# Patient Record
Sex: Female | Born: 1960 | Race: White | Hispanic: No | Marital: Married | Smoking: Former smoker
Health system: Southern US, Community
[De-identification: ages and names within clinical notes are randomized; demographics above are authoritative.]

## PROBLEM LIST (undated history)

## (undated) DIAGNOSIS — K279 Peptic ulcer, site unspecified, unspecified as acute or chronic, without hemorrhage or perforation: Secondary | ICD-10-CM

## (undated) DIAGNOSIS — J189 Pneumonia, unspecified organism: Secondary | ICD-10-CM

## (undated) DIAGNOSIS — M810 Age-related osteoporosis without current pathological fracture: Secondary | ICD-10-CM

## (undated) DIAGNOSIS — T7840XA Allergy, unspecified, initial encounter: Secondary | ICD-10-CM

## (undated) DIAGNOSIS — F32A Depression, unspecified: Secondary | ICD-10-CM

## (undated) DIAGNOSIS — H269 Unspecified cataract: Secondary | ICD-10-CM

## (undated) DIAGNOSIS — K219 Gastro-esophageal reflux disease without esophagitis: Secondary | ICD-10-CM

## (undated) DIAGNOSIS — F419 Anxiety disorder, unspecified: Secondary | ICD-10-CM

## (undated) DIAGNOSIS — I10 Essential (primary) hypertension: Secondary | ICD-10-CM

## (undated) DIAGNOSIS — M1990 Unspecified osteoarthritis, unspecified site: Secondary | ICD-10-CM

## (undated) HISTORY — DX: Anxiety disorder, unspecified: F41.9

## (undated) HISTORY — PX: BREAST EXCISIONAL BIOPSY: SUR124

## (undated) HISTORY — PX: TUBAL LIGATION: SHX77

## (undated) HISTORY — PX: ABDOMINAL HYSTERECTOMY: SHX81

## (undated) HISTORY — DX: Pneumonia, unspecified organism: J18.9

## (undated) HISTORY — DX: Depression, unspecified: F32.A

## (undated) HISTORY — DX: Age-related osteoporosis without current pathological fracture: M81.0

## (undated) HISTORY — PX: ADENOIDECTOMY: SHX5191

## (undated) HISTORY — DX: Allergy, unspecified, initial encounter: T78.40XA

## (undated) HISTORY — DX: Essential (primary) hypertension: I10

## (undated) HISTORY — DX: Peptic ulcer, site unspecified, unspecified as acute or chronic, without hemorrhage or perforation: K27.9

## (undated) HISTORY — PX: BREAST SURGERY: SHX581

## (undated) HISTORY — DX: Gastro-esophageal reflux disease without esophagitis: K21.9

## (undated) HISTORY — PX: BREAST BIOPSY: SHX20

## (undated) HISTORY — DX: Unspecified cataract: H26.9

## (undated) HISTORY — DX: Unspecified osteoarthritis, unspecified site: M19.90

---

## 1983-08-12 HISTORY — PX: MELANOMA EXCISION: SHX5266

## 1993-08-11 HISTORY — PX: PARTIAL HYSTERECTOMY: SHX80

## 1999-06-10 ENCOUNTER — Encounter: Payer: Self-pay | Admitting: Emergency Medicine

## 1999-06-10 ENCOUNTER — Emergency Department (HOSPITAL_COMMUNITY): Admission: EM | Admit: 1999-06-10 | Discharge: 1999-06-10 | Payer: Self-pay

## 1999-06-12 ENCOUNTER — Emergency Department (HOSPITAL_COMMUNITY): Admission: EM | Admit: 1999-06-12 | Discharge: 1999-06-12 | Payer: Self-pay | Admitting: Emergency Medicine

## 2003-01-30 ENCOUNTER — Other Ambulatory Visit: Admission: RE | Admit: 2003-01-30 | Discharge: 2003-01-30 | Payer: Self-pay | Admitting: Obstetrics and Gynecology

## 2006-08-11 HISTORY — PX: OTHER SURGICAL HISTORY: SHX169

## 2006-09-22 ENCOUNTER — Other Ambulatory Visit: Admission: RE | Admit: 2006-09-22 | Discharge: 2006-09-22 | Payer: Self-pay | Admitting: Obstetrics and Gynecology

## 2006-09-24 ENCOUNTER — Encounter: Admission: RE | Admit: 2006-09-24 | Discharge: 2006-09-24 | Payer: Self-pay | Admitting: Obstetrics and Gynecology

## 2006-09-24 ENCOUNTER — Encounter (INDEPENDENT_AMBULATORY_CARE_PROVIDER_SITE_OTHER): Payer: Self-pay | Admitting: *Deleted

## 2007-05-13 ENCOUNTER — Ambulatory Visit (HOSPITAL_BASED_OUTPATIENT_CLINIC_OR_DEPARTMENT_OTHER): Admission: RE | Admit: 2007-05-13 | Discharge: 2007-05-13 | Payer: Self-pay | Admitting: Surgery

## 2007-05-13 ENCOUNTER — Encounter (INDEPENDENT_AMBULATORY_CARE_PROVIDER_SITE_OTHER): Payer: Self-pay | Admitting: Surgery

## 2007-12-28 ENCOUNTER — Encounter: Admission: RE | Admit: 2007-12-28 | Discharge: 2007-12-28 | Payer: Self-pay | Admitting: Surgery

## 2008-01-06 ENCOUNTER — Encounter (INDEPENDENT_AMBULATORY_CARE_PROVIDER_SITE_OTHER): Payer: Self-pay | Admitting: Diagnostic Radiology

## 2008-01-06 ENCOUNTER — Encounter: Admission: RE | Admit: 2008-01-06 | Discharge: 2008-01-06 | Payer: Self-pay | Admitting: Surgery

## 2010-12-24 NOTE — Op Note (Signed)
NAMEJAY, KEMPE NO.:  [id number]   MEDICAL RECORD NO.:  [id number]          PATIENT TYPE:  AMB   LOCATION:  DSC                          FACILITY:  [facility]   PHYSICIAN:  Thornton Park. 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   PROCEDURE:  Left breast lumpectomy.   SURGEON:  Thornton Park. Daphine Deutscher, MD   ANESTHESIA:  General by LMA.   DESCRIPTION OF PROCEDURE:  Jenny Davidson was taken to room #5 at [facility] on May 13, 2007 and given general anesthesia.  The left  breast was prepped with Techni-Care and draped sterilely.  I made a  curvilinear incision more medial to the nipple and worked my work up and  got around this 5- to 7-cm mass.  Using the electrocautery, I stayed  down on it, but did not invade it to preserve to her breast tissue and I  took it off the chest wall.  It was sent for permanent sections.  In the  meantime, I cauterized the base and controlled bleeding with the  electrocautery and really essentially no bleeding was noted.  I  irrigated with saline and then I irrigated again with some 0.5%  Marcaine, which I left in place.  I then closed with a 4-0 Vicryl  subcutaneously and I also closed the depths of the wound somewhat and  then closed the skin was Benzoin and Steri-Strips.  The patient  tolerated the procedure well.  She was given Tylox to take for pain and  will be followed up in the office in about 2-3 weeks.   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      Thornton Park Daphine Deutscher, MD  Electronically Signed     MBM/MEDQ  D:  05/13/2007  T:  05/14/2007  Job:  [id number]

## 2011-05-22 LAB — POCT HEMOGLOBIN-HEMACUE: Hemoglobin: 14.2

## 2011-10-24 ENCOUNTER — Ambulatory Visit (INDEPENDENT_AMBULATORY_CARE_PROVIDER_SITE_OTHER): Payer: 59 | Admitting: Internal Medicine

## 2011-10-24 VITALS — BP 143/89 | HR 79 | Temp 98.3°F | Resp 16 | Ht 63.0 in | Wt 120.0 lb

## 2011-10-24 DIAGNOSIS — I10 Essential (primary) hypertension: Secondary | ICD-10-CM

## 2011-10-24 DIAGNOSIS — R51 Headache: Secondary | ICD-10-CM

## 2011-10-24 DIAGNOSIS — J301 Allergic rhinitis due to pollen: Secondary | ICD-10-CM

## 2011-10-24 DIAGNOSIS — J302 Other seasonal allergic rhinitis: Secondary | ICD-10-CM

## 2011-10-24 MED ORDER — BECLOMETHASONE DIPROPIONATE 40 MCG/ACT IN AERS: 2.0000 | INHALATION_SPRAY | Freq: Two times a day (BID) | RESPIRATORY_TRACT | Status: DC

## 2011-10-24 MED ORDER — KETOROLAC TROMETHAMINE 10 MG PO TABS: 10.0000 mg | ORAL_TABLET | Freq: Four times a day (QID) | ORAL | Status: AC | PRN

## 2011-10-24 MED ORDER — CETIRIZINE HCL 10 MG PO TABS: 10.0000 mg | ORAL_TABLET | Freq: Every day | ORAL | Status: DC

## 2011-10-24 NOTE — Patient Instructions (Signed)
Zyrtec 1 tab daily. qvar as directed. toradol 1 tab every 6 hours for ha or pain. If fever purulent sinus drainage return to the office. Rest increase fluids. rechekc your bp next week.

## 2011-10-24 NOTE — Progress Notes (Signed)
  Subjective:    Patient ID: Jenny Davidson, female    DOB: 04-Apr-1961, 51 y.o.   MRN: [id number]  HPI HA Dizzy Fatigue Onset 2 days ago Pressure in the sinuses Today felt weak at work like she would pass out after she took a benadryl     Review of Systems  Constitutional: Positive for activity change and fatigue.  HENT:       Sinus pain  Eyes: Negative.   Respiratory: Negative.   Cardiovascular: Negative.   Gastrointestinal: Negative.   Genitourinary: Negative.   Musculoskeletal: Negative.   Skin: Negative.   Neurological: Negative.   Hematological: Negative.   Psychiatric/Behavioral: Negative.        Objective:   Physical Exam  Constitutional: She is oriented to person, place, and time. She appears well-developed and well-nourished.  HENT:  Head: Normocephalic and atraumatic.  Right Ear: External ear normal.  Left Ear: External ear normal.  Mouth/Throat: Oropharynx is clear and moist.       Pressure over the maxillary sinuses. No drainage. Swelling of the turbinates.  Eyes: Conjunctivae and EOM are normal. Pupils are equal, round, and reactive to light.  Neck: Normal range of motion. Neck supple.  Cardiovascular: Normal rate, regular rhythm and normal heart sounds.   Pulmonary/Chest: Effort normal and breath sounds normal.  Abdominal: Soft. Bowel sounds are normal.  Musculoskeletal: Normal range of motion.  Neurological: She is alert and oriented to person, place, and time.  Skin: Skin is warm and dry.  Psychiatric: She has a normal mood and affect. Her behavior is normal. Judgment and thought content normal.   Initial bp high ar 160/79 Repeat 143/89     Assessment & Plan:  Seasonal allergies  sinusitus Sinus headache

## 2012-07-21 ENCOUNTER — Ambulatory Visit (INDEPENDENT_AMBULATORY_CARE_PROVIDER_SITE_OTHER): Payer: 59 | Admitting: Family Medicine

## 2012-07-21 VITALS — BP 138/94 | HR 74 | Temp 97.8°F | Resp 16 | Ht 63.0 in | Wt 128.2 lb

## 2012-07-21 DIAGNOSIS — J3489 Other specified disorders of nose and nasal sinuses: Secondary | ICD-10-CM

## 2012-07-21 DIAGNOSIS — H6120 Impacted cerumen, unspecified ear: Secondary | ICD-10-CM

## 2012-07-21 DIAGNOSIS — R0981 Nasal congestion: Secondary | ICD-10-CM

## 2012-07-21 DIAGNOSIS — J019 Acute sinusitis, unspecified: Secondary | ICD-10-CM

## 2012-07-21 MED ORDER — AMOXICILLIN 875 MG PO TABS: 875.0000 mg | ORAL_TABLET | Freq: Two times a day (BID) | ORAL | Status: DC

## 2012-07-21 MED ORDER — FLUTICASONE PROPIONATE 50 MCG/ACT NA SUSP: 2.0000 | Freq: Every day | NASAL | Status: DC

## 2012-07-21 NOTE — Progress Notes (Signed)
  [facility]:  Office Visit  Chief Complaint:  Chief Complaint  Patient presents with  . Pressure Behind the Eyes    x3 days  . Hearing Problem    both ears, but Rt ear is worse    HPI: Jenny Davidson is a 51 y.o. female who complains of sinus pressure and decrease hearing on right side. + bilateral sinus pain. Tried zyrtec and Qvar without relief. Feels not well. Deneis fevers, chills, sore throat, cough.   History reviewed. No pertinent past medical history. Past Surgical History  Procedure Date  . Tumore removed 2008  . Abdominal hysterectomy    History   Social History  . Marital Status: Married    Spouse Name: N/A    Number of Children: N/A  . Years of Education: N/A   Social History Main Topics  . Smoking status: Former Games developer  . Smokeless tobacco: None  . Alcohol Use: None  . Drug Use: None  . Sexually Active: None   Other Topics Concern  . None   Social History Narrative  . None   Family History  Problem Relation Age of Onset  . Heart disease Father    Allergies  Allergen Reactions  . Erythromycin   . Iodine    Prior to Admission medications   Medication Sig Start Date End Date Taking? Authorizing Provider  beclomethasone (QVAR) 40 MCG/ACT inhaler Inhale 2 puffs into the lungs 2 (two) times daily. 10/24/11 10/23/12 Yes Sherlyn Hay, MD  cetirizine (ZYRTEC) 10 MG tablet Take 1 tablet (10 mg total) by mouth daily. 10/24/11 10/23/12 Yes Sherlyn Hay, MD  diphenhydrAMINE (SOMINEX) 25 MG tablet Take 25 mg by mouth at bedtime as needed.   Yes Historical Provider, MD     ROS: The patient denies fevers, chills, night sweats, unintentional weight loss, chest pain, palpitations, wheezing, dyspnea on exertion, nausea, vomiting, abdominal pain, dysuria, hematuria, melena, numbness, weakness, or tingling.   All other systems have been reviewed and were otherwise negative with the exception of those mentioned in the HPI and as above.     PHYSICAL EXAM: Filed Vitals:   07/21/12 1847  BP: 138/94  Pulse: 74  Temp: 97.8 F (36.6 C)  Resp: 16   Filed Vitals:   07/21/12 1847  Height: 5\' 3"  (1.6 m)  Weight: 128 lb 3.2 oz (58.151 kg)   Body mass index is 22.71 kg/(m^2).  General: Alert, no acute distress HEENT:  Normocephalic, atraumatic, oropharynx patent. No exudates, + sinus tenerness max, + cerumen right ear, TM nl in left Cardiovascular:  Regular rate and rhythm, no rubs murmurs or gallops.  No Carotid bruits, radial pulse intact. No pedal edema.  Respiratory: Clear to auscultation bilaterally.  No wheezes, rales, or rhonchi.  No cyanosis, no use of accessory musculature GI: No organomegaly, abdomen is soft and non-tender, positive bowel sounds.  No masses. Skin: No rashes. Neurologic: Facial musculature symmetric. Psychiatric: Patient is appropriate throughout our interaction. Lymphatic: No cervical lymphadenopathy Musculoskeletal: Gait intact.   LABS:    EKG/XRAY:   Primary read interpreted by Dr. Conley Rolls at [facility].   ASSESSMENT/PLAN: Encounter Diagnoses  Name Primary?  . Acute sinusitis Yes  . Nasal congestion   . Hearing loss secondary to cerumen impaction    Disimpacted cerumen Rx Amoxacillin Rx Flonase F/u prn    Jenny Cabanilla PHUONG, DO 07/23/2012 11:42 AM

## 2012-10-21 ENCOUNTER — Other Ambulatory Visit: Payer: Self-pay | Admitting: Family Medicine

## 2012-10-21 DIAGNOSIS — I10 Essential (primary) hypertension: Secondary | ICD-10-CM

## 2012-10-21 DIAGNOSIS — J302 Other seasonal allergic rhinitis: Secondary | ICD-10-CM

## 2012-10-21 MED ORDER — BECLOMETHASONE DIPROPIONATE 40 MCG/ACT IN AERS: 2.0000 | INHALATION_SPRAY | Freq: Two times a day (BID) | RESPIRATORY_TRACT | Status: DC

## 2012-10-21 MED ORDER — CETIRIZINE HCL 10 MG PO TABS: 10.0000 mg | ORAL_TABLET | Freq: Every day | ORAL | Status: DC

## 2012-12-24 ENCOUNTER — Other Ambulatory Visit: Payer: Self-pay | Admitting: Internal Medicine

## 2013-05-26 ENCOUNTER — Other Ambulatory Visit: Payer: Self-pay | Admitting: Family Medicine

## 2013-05-26 DIAGNOSIS — I10 Essential (primary) hypertension: Secondary | ICD-10-CM

## 2013-05-26 DIAGNOSIS — R0981 Nasal congestion: Secondary | ICD-10-CM

## 2013-05-26 DIAGNOSIS — J302 Other seasonal allergic rhinitis: Secondary | ICD-10-CM

## 2013-05-26 MED ORDER — FLUTICASONE PROPIONATE 50 MCG/ACT NA SUSP: 2.0000 | Freq: Every day | NASAL | Status: DC

## 2013-05-26 MED ORDER — BECLOMETHASONE DIPROPIONATE 40 MCG/ACT IN AERS: 2.0000 | INHALATION_SPRAY | Freq: Two times a day (BID) | RESPIRATORY_TRACT | Status: DC

## 2013-06-02 ENCOUNTER — Other Ambulatory Visit: Payer: Self-pay | Admitting: Family Medicine

## 2013-06-02 ENCOUNTER — Other Ambulatory Visit: Payer: Self-pay

## 2013-06-02 DIAGNOSIS — J302 Other seasonal allergic rhinitis: Secondary | ICD-10-CM

## 2013-06-02 DIAGNOSIS — I10 Essential (primary) hypertension: Secondary | ICD-10-CM

## 2013-06-02 MED ORDER — CETIRIZINE HCL 10 MG PO TABS: 10.0000 mg | ORAL_TABLET | Freq: Every day | ORAL | Status: DC

## 2013-12-03 ENCOUNTER — Encounter (HOSPITAL_COMMUNITY): Payer: Self-pay | Admitting: Emergency Medicine

## 2013-12-03 ENCOUNTER — Emergency Department (HOSPITAL_COMMUNITY)
Admission: EM | Admit: 2013-12-03 | Discharge: 2013-12-03 | Disposition: A | Discharge status: Home / Self Care | Payer: 59 | Source: Home / Self Care | Attending: Family Medicine | Admitting: Family Medicine

## 2013-12-03 DIAGNOSIS — W57XXXA Bitten or stung by nonvenomous insect and other nonvenomous arthropods, initial encounter: Secondary | ICD-10-CM

## 2013-12-03 DIAGNOSIS — T148 Other injury of unspecified body region: Secondary | ICD-10-CM

## 2013-12-03 NOTE — ED Provider Notes (Signed)
CSN: [id number]     Arrival date & time 12/03/13  1826 History   First MD Initiated Contact with Patient 12/03/13 1844     Chief Complaint  Patient presents with  . Tick Removal   (Consider location/radiation/quality/duration/timing/severity/associated sxs/prior Treatment) Patient is a 53 y.o. female presenting with rash. The history is provided by the patient and the spouse.  Rash Location:  Head/neck Head/neck rash location:  R neck Severity:  Mild Onset quality:  Sudden Duration:  2 days Chronicity:  New Context comment:  Tick noticed today, uncertain duration of attachment Associated symptoms: no fever and no headaches     History reviewed. No pertinent past medical history. Past Surgical History  Procedure Laterality Date  . Tumore removed  2008  . Abdominal hysterectomy     Family History  Problem Relation Age of Onset  . Heart disease Father    History  Substance Use Topics  . Smoking status: Former Games developermoker  . Smokeless tobacco: Not on file  . Alcohol Use: Not on file   OB History   Grav Para Term Preterm Abortions TAB SAB Ect Mult Living                 Review of Systems  Constitutional: Negative.  Negative for fever.  Skin: Negative for rash.  Neurological: Negative for headaches.    Allergies  Erythromycin and Iodine  Home Medications   Prior to Admission medications   Medication Sig Start Date End Date Taking? Authorizing Provider  beclomethasone (QVAR) 40 MCG/ACT inhaler Inhale 2 puffs into the lungs 2 (two) times daily. 05/26/13 05/26/14  Thao P Le, DO  cetirizine (ZYRTEC) 10 MG tablet Take 1 tablet (10 mg total) by mouth daily. 06/02/13 06/02/14  Thao P Le, DO  diphenhydrAMINE (SOMINEX) 25 MG tablet Take 25 mg by mouth at bedtime as needed.    Historical Provider, MD  fluticasone (FLONASE) 50 MCG/ACT nasal spray Place 2 sprays into the nose daily. 05/26/13   Thao P Le, DO   Pulse 64  Temp(Src) 97.8 F (36.6 C) (Oral)  Resp 18  SpO2  97% Physical Exam  Nursing note and vitals reviewed. Constitutional: She appears well-developed and well-nourished.  HENT:  Head: Normocephalic.  Neck: Normal range of motion. Neck supple.  Skin: Skin is warm and dry.  Tick attached to right neck.     ED Course  Procedures (including critical care time) Labs Review Labs Reviewed - No data to display  Imaging Review No results found.   MDM   1. Tick bite    Tick removed without difficulty.    Linna HoffJames D Kindl, MD 12/03/13 [id number]

## 2013-12-03 NOTE — ED Notes (Signed)
Pt reports a tick bite on right side of neck; noticed it this afternoon Denies any sx Alert w/no signs of acute distress.

## 2013-12-09 ENCOUNTER — Ambulatory Visit (INDEPENDENT_AMBULATORY_CARE_PROVIDER_SITE_OTHER): Payer: 59 | Admitting: Physician Assistant

## 2013-12-09 VITALS — BP 118/72 | HR 74 | Temp 98.1°F | Resp 16 | Ht 63.5 in | Wt 138.8 lb

## 2013-12-09 DIAGNOSIS — S1096XA Insect bite of unspecified part of neck, initial encounter: Secondary | ICD-10-CM

## 2013-12-09 DIAGNOSIS — M791 Myalgia, unspecified site: Secondary | ICD-10-CM

## 2013-12-09 DIAGNOSIS — M8580 Other specified disorders of bone density and structure, unspecified site: Secondary | ICD-10-CM | POA: Insufficient documentation

## 2013-12-09 DIAGNOSIS — R059 Cough, unspecified: Secondary | ICD-10-CM

## 2013-12-09 DIAGNOSIS — J309 Allergic rhinitis, unspecified: Secondary | ICD-10-CM

## 2013-12-09 DIAGNOSIS — W57XXXA Bitten or stung by nonvenomous insect and other nonvenomous arthropods, initial encounter: Secondary | ICD-10-CM

## 2013-12-09 DIAGNOSIS — R51 Headache: Secondary | ICD-10-CM

## 2013-12-09 DIAGNOSIS — R05 Cough: Secondary | ICD-10-CM

## 2013-12-09 DIAGNOSIS — IMO0001 Reserved for inherently not codable concepts without codable children: Secondary | ICD-10-CM

## 2013-12-09 DIAGNOSIS — J302 Other seasonal allergic rhinitis: Secondary | ICD-10-CM | POA: Insufficient documentation

## 2013-12-09 MED ORDER — TRIAMCINOLONE ACETONIDE 0.5 % EX CREA: 1.0000 "application " | TOPICAL_CREAM | Freq: Two times a day (BID) | CUTANEOUS | Status: DC

## 2013-12-09 MED ORDER — HYDROCODONE-HOMATROPINE 5-1.5 MG/5ML PO SYRP: 5.0000 mL | ORAL_SOLUTION | Freq: Three times a day (TID) | ORAL | Status: DC | PRN

## 2013-12-09 MED ORDER — ALBUTEROL SULFATE HFA 108 (90 BASE) MCG/ACT IN AERS: 2.0000 | INHALATION_SPRAY | Freq: Four times a day (QID) | RESPIRATORY_TRACT | Status: DC | PRN

## 2013-12-09 MED ORDER — GUAIFENESIN ER 1200 MG PO TB12: 1.0000 | ORAL_TABLET | Freq: Two times a day (BID) | ORAL | Status: AC

## 2013-12-09 MED ORDER — DOXYCYCLINE HYCLATE 100 MG PO TABS: 100.0000 mg | ORAL_TABLET | Freq: Two times a day (BID) | ORAL | Status: DC

## 2013-12-09 MED ORDER — FLUTICASONE PROPIONATE 50 MCG/ACT NA SUSP: 2.0000 | Freq: Every day | NASAL | Status: DC

## 2013-12-09 NOTE — Patient Instructions (Signed)
Use the albuterol for when you are coughing or feel short of breath. The Mucinex will loosen up the mucus in your sinuses. The Flonase will help with your nasal congestion, seasonal allergies, and inflammation in your nose. The Doxycyline will cover all tick borne illnesses. The cough medication will help you sleep at night.

## 2013-12-09 NOTE — Progress Notes (Signed)
Subjective:    Patient ID: Jenny Davidson, female    DOB: 1961-06-10, 53 y.o.   MRN: [id number]  HPI Pt presents to clinic with several concerns - she is unsure whether they are related or not.  She has year-round allergies that are worse in the spring.  She is using Zyrtec daily along with her Qvar.  She has sinus congestion and pressure which is normal for her but she is being kept up with a cough at night that is dry and seems to be coming from her chest.  She is also worried about a tick bite that she was seen at [facility] 6 days ago and had it removed.  She has a picture of it before it was removed.  2 days ago she started with a headache that was more in her neck and neck muscles hurting along with her sinus pressure headache.  She has not had a fever and only has itching and a rash around the tick bite site.    Review of Systems  Constitutional: Negative for fever and chills.  HENT: Positive for congestion, postnasal drip, rhinorrhea and sore throat.        All these symptoms are allergy related per patient  Respiratory: Positive for cough (dry) and shortness of breath (with cough at night).   Gastrointestinal: Negative.   Musculoskeletal: Positive for myalgias (neck area).  Allergic/Immunologic: Positive for environmental allergies.  Neurological: Positive for headaches. Negative for dizziness and weakness.  Psychiatric/Behavioral: Positive for sleep disturbance (cough at night).       Objective:   Physical Exam  Vitals reviewed. Constitutional: She is oriented to person, place, and time. She appears well-developed and well-nourished.  HENT:  Head: Normocephalic and atraumatic.  Right Ear: Hearing, tympanic membrane, external ear and ear canal normal.  Left Ear: Hearing, tympanic membrane, external ear and ear canal normal.  Eyes: Conjunctivae are normal.  Neck: Normal range of motion.  Cardiovascular: Normal rate, regular rhythm and normal heart sounds.   No murmur  heard. Pulmonary/Chest: Effort normal and breath sounds normal. She has no wheezes.  Musculoskeletal:       Cervical back: She exhibits tenderness, bony tenderness and spasm. She exhibits normal range of motion.  Lymphadenopathy:    She has no cervical adenopathy.  Neurological: She is alert and oriented to person, place, and time. She has normal strength. No cranial nerve deficit or sensory deficit.  Reflex Scores:      Bicep reflexes are 2+ on the right side and 2+ on the left side.      Brachioradialis reflexes are 2+ on the right side and 2+ on the left side.      Patellar reflexes are 2+ on the right side and 2+ on the left side. Skin: Skin is warm and dry.     Psychiatric: She has a normal mood and affect. Her behavior is normal. Judgment and thought content normal.   Picture of tick on patient's phone was engorged tick in the neck.       Assessment & Plan:  Seasonal allergies - Plan: fluticasone (FLONASE) 50 MCG/ACT nasal spray  Tick bite of neck - Plan: B. burgdorfi antibodies, doxycycline (VIBRA-TABS) 100 MG tablet, Rocky mtn spotted fvr ab, IgM-blood, Ehrlichia Antibody Panel, triamcinolone cream (KENALOG) 0.5 %  Cough - Plan: Guaifenesin (MUCINEX MAXIMUM STRENGTH) 1200 MG TB12, albuterol (PROVENTIL HFA;VENTOLIN HFA) 108 (90 BASE) MCG/ACT inhaler, HYDROcodone-homatropine (HYCODAN) 5-1.5 MG/5ML syrup  Headache(784.0) - Plan: B. burgdorfi antibodies, [facility]  [facility] spotted fvr ab, IgM-blood, Ehrlichia Antibody Panel  Myalgia - Plan: B. burgdorfi antibodies, Rocky mtn spotted fvr ab, IgM-blood, Ehrlichia Antibody Panel  I suspect that patient has uncontrolled allergies that is aggravation a reactive airway disease resulting in a cough.  We will continue her Zyrtec and add Flonase.  For her cough we will give her a rescue inhaler to see if that will help with the cough.  I am also concerned about the engorged tick was was removed a week ago and now myalgias.and headache that the  patient is experiencing though they may be related to her coughing.  She does not have erythema migrans around the tick bite but rather a local allergic reaction.  We will cover her for tick borne illnesses while we wait for the labs.  She will use NSAIDs for her headache and myalgias.  Benny LennertSarah Norita Meigs PA-C  [facility] 12/09/2013 1:24 PM

## 2013-12-12 LAB — ROCKY MTN SPOTTED FVR AB, IGM-BLOOD: ROCKY MTN SPOTTED FEVER, IGM: 1.02 IV — ABNORMAL HIGH

## 2013-12-12 LAB — B. BURGDORFI ANTIBODIES: B burgdorferi Ab IgG+IgM: 0.6 {ISR}

## 2013-12-13 LAB — EHRLICHIA ANTIBODY PANEL
E chaffeensis (HGE) Ab, IgG: <1:64 {titer}
E chaffeensis (HGE) Ab, IgM: <1:20 {titer}

## 2014-02-05 ENCOUNTER — Other Ambulatory Visit: Payer: Self-pay | Admitting: Family Medicine

## 2014-08-05 ENCOUNTER — Other Ambulatory Visit: Payer: Self-pay | Admitting: Family Medicine

## 2015-01-23 ENCOUNTER — Other Ambulatory Visit: Payer: Self-pay | Admitting: Physician Assistant

## 2015-07-15 ENCOUNTER — Ambulatory Visit (INDEPENDENT_AMBULATORY_CARE_PROVIDER_SITE_OTHER): Payer: 59 | Admitting: Family Medicine

## 2015-07-15 VITALS — BP 120/80 | HR 66 | Temp 98.3°F | Resp 17 | Ht 64.0 in | Wt 131.8 lb

## 2015-07-15 DIAGNOSIS — M2041 Other hammer toe(s) (acquired), right foot: Secondary | ICD-10-CM

## 2015-07-15 DIAGNOSIS — M2042 Other hammer toe(s) (acquired), left foot: Secondary | ICD-10-CM | POA: Diagnosis not present

## 2015-07-15 DIAGNOSIS — L600 Ingrowing nail: Secondary | ICD-10-CM

## 2015-07-15 DIAGNOSIS — J302 Other seasonal allergic rhinitis: Secondary | ICD-10-CM

## 2015-07-15 DIAGNOSIS — M722 Plantar fascial fibromatosis: Secondary | ICD-10-CM

## 2015-07-15 DIAGNOSIS — R059 Cough, unspecified: Secondary | ICD-10-CM

## 2015-07-15 DIAGNOSIS — Z23 Encounter for immunization: Secondary | ICD-10-CM

## 2015-07-15 DIAGNOSIS — R05 Cough: Secondary | ICD-10-CM

## 2015-07-15 MED ORDER — HYDROCODONE-ACETAMINOPHEN 5-325 MG PO TABS: 1.0000 | ORAL_TABLET | Freq: Four times a day (QID) | ORAL | Status: DC | PRN

## 2015-07-15 MED ORDER — FLUTICASONE PROPIONATE 50 MCG/ACT NA SUSP: 2.0000 | Freq: Every day | NASAL | Status: DC

## 2015-07-15 MED ORDER — ALBUTEROL SULFATE HFA 108 (90 BASE) MCG/ACT IN AERS: 2.0000 | INHALATION_SPRAY | Freq: Four times a day (QID) | RESPIRATORY_TRACT | Status: DC | PRN

## 2015-07-15 MED ORDER — CETIRIZINE HCL 10 MG PO TABS: ORAL_TABLET | ORAL | Status: DC

## 2015-07-15 MED ORDER — PREDNISONE 20 MG PO TABS: ORAL_TABLET | ORAL | Status: DC

## 2015-07-15 NOTE — Progress Notes (Signed)
 @UMFCLOGO @  By signing my name below, I, [name], attest that this documentation has been prepared under the direction and in the presence of [name], MD.  Electronically Signed: [name], ED Scribe. 07/15/2015. 3:42 PM.  Patient ID: Jenny Davidson MRN: [id number], DOB: 13-Jun-1961, 54 y.o. Date of Encounter: 07/15/2015, 3:40 PM  Primary Physician: No primary care provider on file.  Chief Complaint:  Chief Complaint  Patient presents with   Other    left foot pain, ingrow toenail   Flu Vaccine     HPI: 54 y.o. year old female with history below presents with left heel pain. She states she work on her feet throughout the day. Pt is allergic to erythromycin and iodine.   She also c/o of ingrown toe nail to left great toe. She reports toe is tender to touch and has pain with walking.  She has also noticed that digits 3-5 on left foot are beginning to lean more to the left away from the first 2 digits.    Pt would also like a refill of zyrtec and flonase.  Pt would like a flu shot.  Pt works at [employer].    Past Medical History  Diagnosis Date   Allergy    Arthritis    Osteoporosis      Home Meds: Prior to Admission medications   Medication Sig Start Date End Date Taking? Authorizing Provider  albuterol (PROVENTIL HFA;VENTOLIN HFA) 108 (90 BASE) MCG/ACT inhaler Inhale 2 puffs into the lungs every 6 (six) hours as needed for wheezing or shortness of breath. 12/09/13  Yes Morrell RiddleSarah L Weber, PA-C  cetirizine (ZYRTEC) 10 MG tablet TAKE 1 TABLET BY MOUTH DAILY. "OV NEEDED FOR ADDITIONAL REFILLS" 01/24/15  Yes Morrell RiddleSarah L Weber, PA-C  doxycycline (VIBRA-TABS) 100 MG tablet Take 1 tablet (100 mg total) by mouth 2 (two) times daily. 12/09/13  Yes Morrell RiddleSarah L Weber, PA-C  fluticasone (FLONASE) 50 MCG/ACT nasal spray Place 2 sprays into both nostrils daily. 12/09/13  Yes Morrell RiddleSarah L Weber, PA-C  HYDROcodone-homatropine (HYCODAN) 5-1.5 MG/5ML syrup Take 5 mLs by mouth every 8 (eight) hours as  needed for cough. 12/09/13  Yes Morrell RiddleSarah L Weber, PA-C  triamcinolone cream (KENALOG) 0.5 % Apply 1 application topically 2 (two) times daily. 12/09/13  Yes Morrell RiddleSarah L Weber, PA-C    Allergies:  Allergies  Allergen Reactions   Erythromycin Nausea And Vomiting   Iodine Rash    Social History   Social History   Marital Status: Married    Spouse Name: N/A   Number of Children: N/A   Years of Education: N/A   Occupational History   Not on file.   Social History Main Topics   Smoking status: Former Smoker   Smokeless tobacco: Not on file   Alcohol Use: Yes   Drug Use: Not on file   Sexual Activity: Not on file   Other Topics Concern   Not on file   Social History Narrative     Review of Systems: Constitutional: negative for chills, fever, night sweats, weight changes, or fatigue  HEENT: negative for vision changes, hearing loss, congestion, rhinorrhea, ST, epistaxis, or sinus pressure Cardiovascular: negative for chest pain or palpitations Respiratory: negative for hemoptysis, wheezing, shortness of breath, or cough Abdominal: negative for abdominal pain, nausea, vomiting, diarrhea, or constipation Dermatological: negative for rash Neurologic: negative for headache, dizziness, or syncope All other systems reviewed and are otherwise negative with the exception to those above and in the HPI.   Physical  Exam: Blood pressure 120/80, pulse 66, temperature 98.3 F (36.8 C), temperature source Oral, resp. rate 17, height  (1.626 m), weight 131 lb 12.8 oz (59.784 kg), SpO2 98 %., Body mass index is 22.61 kg/(m^2). General: Well developed, well nourished, in no acute distress. Head: Normocephalic, atraumatic, eyes without discharge, sclera non-icteric, nares are without discharge. Bilateral auditory canals clear, TM's are without perforation, pearly grey and translucent with reflective cone of light bilaterally. Oral cavity moist, posterior pharynx without exudate,  erythema, peritonsillar abscess, or post nasal drip.  Neck: Supple. No thyromegaly. Full ROM. No lymphadenopathy. Lungs: Clear bilaterally to auscultation without wheezes, rales, or rhonchi. Breathing is unlabored. Heart: RRR with S1 S2. No murmurs, rubs, or gallops appreciated. Abdomen: Soft, non-tender, non-distended with normoactive bowel sounds. No hepatomegaly. No rebound/guarding. No obvious abdominal masses. Msk:  Strength and tone normal for age. Extremities/Skin: Warm and dry. No clubbing or cyanosis. No edema. No rashes or suspicious lesions. Neuro: Alert and oriented X 3. Moves all extremities spontaneously. Gait is normal. CNII-XII grossly in tact. Psych:  Responds to questions appropriately with a normal affect.    ASSESSMENT AND PLAN:  54 y.o. year old female with  This chart was scribed in my presence and reviewed by me personally.    ICD-9-CM ICD-10-CM   1. Seasonal allergies 477.9 J30.2 predniSONE (DELTASONE) 20 MG tablet     fluticasone (FLONASE) 50 MCG/ACT nasal spray  2. Cough 786.2 R05 predniSONE (DELTASONE) 20 MG tablet     albuterol (PROVENTIL HFA;VENTOLIN HFA) 108 (90 BASE) MCG/ACT inhaler     cetirizine (ZYRTEC) 10 MG tablet  3. Plantar fasciitis of left foot 728.71 M72.2   4. Ingrown left big toenail 703.0 L60.0 Ambulatory referral to Podiatry  5. Acquired hammer toes of both feet 735.4 M20.41 Ambulatory referral to Podiatry    M20.42      Signed, Elvina Sidle, MD    Signed, Elvina Sidle, MD 07/15/2015 3:40 PM

## 2015-07-15 NOTE — Patient Instructions (Addendum)
INGROWN TOENAIL . Keep area clean, dry and bandaged for 24 hours. . After 24 hours, remove outer bandage and leave yellow gauze in place. Jenny Davidson. Soak toe/foot in warm soapy water for 5-10 minutes, once daily for 5 days. Rebandage toe after each cleaning. . Continue soaks until yellow gauze falls off. . Notify the office if you experience any of the following signs of infection: Swelling, redness, pus drainage, streaking, fever > 101.0 F    Influenza Virus Vaccine injection What is this medicine? INFLUENZA VIRUS VACCINE (in floo EN zuh VAHY ruhs vak SEEN) helps to reduce the risk of getting influenza also known as the flu. The vaccine only helps protect you against some strains of the flu. This medicine may be used for other purposes; ask your health care provider or pharmacist if you have questions. What should I tell my health care provider before I take this medicine? They need to know if you have any of these conditions: -bleeding disorder like hemophilia -fever or infection -Guillain-Barre syndrome or other neurological problems -immune system problems -infection with the human immunodeficiency virus (HIV) or AIDS -low blood platelet counts -multiple sclerosis -an unusual or allergic reaction to influenza virus vaccine, latex, other medicines, foods, dyes, or preservatives. Different brands of vaccines contain different allergens. Some may contain latex or eggs. Talk to your doctor about your allergies to make sure that you get the right vaccine. -pregnant or trying to get pregnant -breast-feeding How should I use this medicine? This vaccine is for injection into a muscle or under the skin. It is given by a health care professional. A copy of Vaccine Information Statements will be given before each vaccination. Read this sheet carefully each time. The sheet may change frequently. Talk to your healthcare provider to see which vaccines are right for you. Some vaccines should not be used  in all age groups. Overdosage: If you think you have taken too much of this medicine contact a poison control center or emergency room at once. NOTE: This medicine is only for you. Do not share this medicine with others. What if I miss a dose? This does not apply. What may interact with this medicine? -chemotherapy or radiation therapy -medicines that lower your immune system like etanercept, anakinra, infliximab, and adalimumab -medicines that treat or prevent blood clots like warfarin -phenytoin -steroid medicines like prednisone or cortisone -theophylline -vaccines This list may not describe all possible interactions. Give your health care provider a list of all the medicines, herbs, non-prescription drugs, or dietary supplements you use. Also tell them if you smoke, drink alcohol, or use illegal drugs. Some items may interact with your medicine. What should I watch for while using this medicine? Report any side effects that do not go away within 3 days to your doctor or health care professional. Call your health care provider if any unusual symptoms occur within 6 weeks of receiving this vaccine. You may still catch the flu, but the illness is not usually as bad. You cannot get the flu from the vaccine. The vaccine will not protect against colds or other illnesses that may cause fever. The vaccine is needed every year. What side effects may I notice from receiving this medicine? Side effects that you should report to your doctor or health care professional as soon as possible: -allergic reactions like skin rash, itching or hives, swelling of the face, lips, or tongue Side effects that usually do not require medical attention (report to your doctor or health care professional  if they continue or are bothersome): -fever -headache -muscle aches and pains -pain, tenderness, redness, or swelling at the injection site -tiredness This list may not describe all possible side effects. Call your  doctor for medical advice about side effects. You may report side effects to FDA at 1-800-FDA-1088. Where should I keep my medicine? The vaccine will be given by a health care professional in a clinic, pharmacy, doctor's office, or other health care setting. You will not be given vaccine doses to store at home. NOTE: This sheet is a summary. It may not cover all possible information. If you have questions about this medicine, talk to your doctor, pharmacist, or health care provider.    2016, Elsevier/Gold Standard. (2015-02-16 10:07:28)

## 2015-07-15 NOTE — Progress Notes (Signed)
Verbal Consent Obtained. Digital Block with 5 cc 2% Lidocaine plain. Sterile Prep and Drape. Medial 1/4 left great  toenail lifted and excised. Xeroform placed. Cleansed and dressed.

## 2015-08-14 ENCOUNTER — Encounter: Payer: Commercial Managed Care - HMO | Admitting: Podiatry

## 2015-08-14 ENCOUNTER — Ambulatory Visit: Payer: Self-pay

## 2015-08-14 DIAGNOSIS — M722 Plantar fascial fibromatosis: Secondary | ICD-10-CM

## 2015-08-16 ENCOUNTER — Ambulatory Visit (INDEPENDENT_AMBULATORY_CARE_PROVIDER_SITE_OTHER): Payer: Commercial Managed Care - HMO

## 2015-08-16 ENCOUNTER — Ambulatory Visit (INDEPENDENT_AMBULATORY_CARE_PROVIDER_SITE_OTHER): Payer: Commercial Managed Care - HMO | Admitting: Podiatry

## 2015-08-16 ENCOUNTER — Encounter: Payer: Self-pay | Admitting: Podiatry

## 2015-08-16 VITALS — BP 141/102 | HR 74 | Resp 12

## 2015-08-16 DIAGNOSIS — M722 Plantar fascial fibromatosis: Secondary | ICD-10-CM | POA: Diagnosis not present

## 2015-08-16 MED ORDER — MELOXICAM 15 MG PO TABS: 15.0000 mg | ORAL_TABLET | Freq: Every day | ORAL | Status: DC

## 2015-08-16 NOTE — Progress Notes (Signed)
   Subjective:    Patient ID: Jenny Davidson, female    DOB: Aug 25, 1960, 55 y.o.   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 She states that mornings seem to be worse and then after she has been sitting for a while it becomes painful as she gets back up. She also states that she's had an ingrown toenail taken care of by urgent care and she also has had some concerns about her toes turning outward.    Review of Systems  HENT: Positive for sinus pressure.   Musculoskeletal: Positive for myalgias, joint swelling and gait problem.       Objective:   Physical Exam: 55 year old white female by signs stable alert and oriented 3. Pulses are strongly palpable. No acute distress. Neurologic sensorium is intact percent was the monofilament. Deep tendon reflexes are intact and muscle strength +5 over 5 dorsiflexion plantar flexors and inverters everters onto the musculatures intact. Orthopedic evaluation of his wrist pain on palpation medial calcaneal tubercle of the left heel radiographs confirm soft tissue increased density plantar fascial continuous suction of the left heel. She has no lateral deviation of the toes that I can visualize. It appeared to be rectus and in good position. Cutaneous evaluations are supple well-hydrated cutis a partial nail lotion was performed fibular border hallux left for what I suspect to be an ingrown toenail. There is no signs of infection at this point.        Assessment & Plan:  Assessment: Plantar fasciitis left foot.  Plan: To early to use another Medrol Dosepak so started her on meloxicam. Injected her left heel with Kenalog and local anesthetic. Discussed appropriate shoe gear stretching exercises and ice therapy. Placed her in a plantar fascial brace and the night splint. I will follow-up with her in 1 month.

## 2015-08-21 ENCOUNTER — Other Ambulatory Visit: Payer: Self-pay | Admitting: Family Medicine

## 2015-09-11 ENCOUNTER — Ambulatory Visit (INDEPENDENT_AMBULATORY_CARE_PROVIDER_SITE_OTHER): Payer: Commercial Managed Care - HMO | Admitting: Podiatry

## 2015-09-11 ENCOUNTER — Encounter: Payer: Self-pay | Admitting: Podiatry

## 2015-09-11 VITALS — BP 154/85 | HR 73 | Resp 16

## 2015-09-11 DIAGNOSIS — M722 Plantar fascial fibromatosis: Secondary | ICD-10-CM

## 2015-09-11 NOTE — Progress Notes (Signed)
This encounter was created in error - please disregard.

## 2015-09-11 NOTE — Progress Notes (Signed)
She presents today for follow-up of her left heel. She states this seems to be doing some better but not a lot.   Objective: Vital signs are stable alert and oriented 3. Pulses are palpable. Pain on palpation medially located with the left heel.  Assessment: chronic intractable plantar fasciitis left heel.  Plan: we injected the left heel today and she will continue all of her anti-inflammatories. Follow up with her in 1 month.

## 2015-09-17 ENCOUNTER — Telehealth: Payer: Self-pay | Admitting: Family Medicine

## 2015-09-17 ENCOUNTER — Other Ambulatory Visit: Payer: Self-pay | Admitting: Family Medicine

## 2015-09-17 MED ORDER — OSELTAMIVIR PHOSPHATE 75 MG PO CAPS: 75.0000 mg | ORAL_CAPSULE | Freq: Two times a day (BID) | ORAL | Status: DC

## 2015-09-17 NOTE — Telephone Encounter (Signed)
Was taking care of husband with flu like ss, she now has it. Will rx tamiflu and also note for work in Occupational hygienist.

## 2015-09-18 ENCOUNTER — Encounter: Payer: Self-pay | Admitting: *Deleted

## 2015-09-25 ENCOUNTER — Other Ambulatory Visit: Payer: Self-pay | Admitting: Physician Assistant

## 2015-10-16 ENCOUNTER — Ambulatory Visit: Payer: Commercial Managed Care - HMO | Admitting: Podiatry

## 2015-12-13 ENCOUNTER — Other Ambulatory Visit: Payer: Self-pay | Admitting: Family Medicine

## 2015-12-21 ENCOUNTER — Other Ambulatory Visit: Payer: Self-pay | Admitting: Podiatry

## 2015-12-21 NOTE — Telephone Encounter (Signed)
Pt was instructed to follow up 1 month after last office visit.

## 2016-01-05 ENCOUNTER — Other Ambulatory Visit: Payer: Self-pay | Admitting: Family Medicine

## 2016-01-16 ENCOUNTER — Other Ambulatory Visit: Payer: Self-pay | Admitting: Internal Medicine

## 2016-01-16 ENCOUNTER — Ambulatory Visit (INDEPENDENT_AMBULATORY_CARE_PROVIDER_SITE_OTHER): Payer: Commercial Managed Care - HMO | Admitting: Internal Medicine

## 2016-01-16 VITALS — BP 144/94 | HR 84 | Temp 98.5°F | Resp 16 | Ht 63.5 in | Wt 129.0 lb

## 2016-01-16 DIAGNOSIS — R6884 Jaw pain: Secondary | ICD-10-CM

## 2016-01-16 MED ORDER — CYCLOBENZAPRINE HCL 10 MG PO TABS: 10.0000 mg | ORAL_TABLET | Freq: Every day | ORAL | Status: DC

## 2016-01-16 MED ORDER — DICLOFENAC SODIUM 75 MG PO TBEC: 75.0000 mg | DELAYED_RELEASE_TABLET | Freq: Two times a day (BID) | ORAL | Status: DC

## 2016-01-16 NOTE — Patient Instructions (Signed)
     IF you received an x-ray today, you will receive an invoice from [facility]. Please contact [facility] at [phone] with questions or concerns regarding your invoice.   IF you received labwork today, you will receive an invoice from [facility]. Please contact [facility] at [phone] with questions or concerns regarding your invoice.   Our billing staff will not be able to assist you with questions regarding bills from these companies.  You will be contacted with the lab results as soon as they are available. The fastest way to get your results is to activate your My Chart account. Instructions are located on the last page of this paperwork. If you have not heard from us regarding the results in 2 weeks, please contact this office.      

## 2016-01-16 NOTE — Progress Notes (Addendum)
Subjective:  By signing my name below, I, [name], attest that this documentation has been prepared under the direction and in the presence of Ellamae Sia, MD.  Electronically Signed: Andrew Au, ED Scribe. 01/16/2016. 6:01 PM.   Patient ID: Jenny Davidson, female    DOB: Nov 01, 1960, 55 y.o.   MRN: [id number]  HPI Chief Complaint  Patient presents with  . Jaw Pain    x 3 days, right side    HPI Comments: Jenny Davidson is a 55 y.o. female who presents to the [facility] complaining of intermittent, throbbing right jaw pain that began 3 days ago. She describes pain at this time as dull, but when pain becomes excruciating she develops right ear pain. She admits clenching her teeth at work due to stress. Pt denies eating or biting down triggering pain. She denies facial rash.   Patient Active Problem List   Diagnosis Date Noted  . Seasonal allergies 12/09/2013  . Osteopenia 12/09/2013   Past Medical History  Diagnosis Date  . Allergy   . Arthritis   . Osteoporosis    Past Surgical History  Procedure Laterality Date  . Tumore removed  2008  . Abdominal hysterectomy    . Breast surgery    . Tubal ligation     Allergies  Allergen Reactions  . Erythromycin Nausea And Vomiting  . Iodine Rash   Prior to Admission medications   Medication Sig Start Date End Date Taking? Authorizing Provider  albuterol (PROVENTIL HFA;VENTOLIN HFA) 108 (90 BASE) MCG/ACT inhaler Inhale 2 puffs into the lungs every 6 (six) hours as needed for wheezing or shortness of breath. 07/15/15  Yes Elvina Sidle, MD  cetirizine (ZYRTEC) 10 MG tablet TAKE 1 TABLET BY MOUTH EVERY DAY 01/08/16  Yes Elvina Sidle, MD  fluticasone [facility]) 50 MCG/ACT nasal spray Place 2 sprays into both nostrils daily. Patient not taking: Reported on 01/16/2016 07/15/15   Elvina Sidle, MD  HYDROcodone-acetaminophen [facility]) 5-325 MG tablet Take 1 tablet by mouth every 6 (six) hours as needed. Patient not  taking: Reported on 01/16/2016 07/15/15   Raelyn Ensign, PA  meloxicam (MOBIC) 15 MG tablet TAKE 1 TABLET(15 MG) BY MOUTH DAILY Patient not taking: Reported on 01/16/2016 12/21/15   Max T Hyatt, DPM  oseltamivir (TAMIFLU) 75 MG capsule Take 1 capsule (75 mg total) by mouth 2 (two) times daily. Patient not taking: Reported on 01/16/2016 09/17/15   Thao P Le, DO  predniSONE (DELTASONE) 20 MG tablet Two daily with food Patient not taking: Reported on 01/16/2016 07/15/15   Elvina Sidle, MD   Social History   Social History  . Marital Status: Married    Spouse Name: N/A  . Number of Children: N/A  . Years of Education: N/A   Occupational History  . Not on file.   Social History Main Topics  . Smoking status: Former Games developer  . Smokeless tobacco: Not on file  . Alcohol Use: Yes  . Drug Use: Not on file  . Sexual Activity: Not on file   Other Topics Concern  . Not on file   Social History Narrative    Review of Systems  HENT: Positive for ear pain. Negative for dental problem, facial swelling and trouble swallowing.   Skin: Negative for rash.     Objective:   Physical Exam  Constitutional: She is oriented to person, place, and time. She appears well-developed and well-nourished. No distress.  HENT:  Head: Normocephalic and atraumatic.  Tender right  TMJ and mandible tends to move further laterally to right side more than left. There are no dental abscess or carries. No intraoral lesion. Ear canal with wax on right. No tenderness or swelling.   Eyes: Conjunctivae and EOM are normal.  Neck: Neck supple.  Cardiovascular: Normal rate.   Pulmonary/Chest: Effort normal.  Musculoskeletal: Normal range of motion.  Lymphadenopathy:       Head (right side): No submandibular adenopathy present.       Head (left side): No submandibular adenopathy present.    She has no cervical adenopathy.  Neurological: She is alert and oriented to person, place, and time.  Skin: Skin is warm and dry.    Psychiatric: She has a normal mood and affect. Her behavior is normal.  Nursing note and vitals reviewed.   Filed Vitals:   01/16/16 1745  BP: 144/94  Pulse: 84  Temp: 98.5 F (36.9 C)  Resp: 16  Height: 5' 3.5" (1.613 m)  Weight: 129 lb (58.514 kg)  SpO2: 98%     Assessment & Plan:  I have completed the patient encounter in its entirety as documented by the scribe, with editing by me where necessary. Jenny Davidson P. Merla Richesoolittle, M.D. Pain in upper jaw  TMJ tendinitis Meds ordered this encounter  Medications  . diclofenac (VOLTAREN) 75 MG EC tablet    Sig: Take 1 tablet (75 mg total) by mouth 2 (two) times daily. For jaw pain    Dispense:  60 tablet    Refill:  0  . cyclobenzaprine (FLEXERIL) 10 MG tablet    Sig: Take 1 tablet (10 mg total) by mouth at bedtime. To relax muscles    Dispense:  15 tablet    Refill:  0   Will need dental evaluation next Soft foods til well Return here if new symptoms first

## 2016-02-16 ENCOUNTER — Other Ambulatory Visit: Payer: Self-pay | Admitting: Internal Medicine

## 2016-03-23 ENCOUNTER — Other Ambulatory Visit: Payer: Self-pay | Admitting: Family Medicine

## 2016-03-25 ENCOUNTER — Encounter (HOSPITAL_COMMUNITY): Payer: Self-pay | Admitting: Emergency Medicine

## 2016-03-25 ENCOUNTER — Ambulatory Visit (HOSPITAL_COMMUNITY)
Admission: EM | Admit: 2016-03-25 | Discharge: 2016-03-25 | Disposition: A | Discharge status: Home / Self Care | Payer: Commercial Managed Care - HMO | Attending: Family Medicine | Admitting: Family Medicine

## 2016-03-25 DIAGNOSIS — S61451A Open bite of right hand, initial encounter: Secondary | ICD-10-CM | POA: Diagnosis not present

## 2016-03-25 DIAGNOSIS — W5501XA Bitten by cat, initial encounter: Secondary | ICD-10-CM

## 2016-03-25 MED ORDER — AMOXICILLIN-POT CLAVULANATE 875-125 MG PO TABS: 1.0000 | ORAL_TABLET | Freq: Two times a day (BID) | ORAL | 0 refills | Status: AC

## 2016-03-25 NOTE — ED Provider Notes (Signed)
CSN: [id number]     Arrival date & time 03/25/16  1803 History   First MD Initiated Contact with Patient 03/25/16 1909     Chief Complaint  Patient presents with  . Animal Bite   (Consider location/radiation/quality/duration/timing/severity/associated sxs/prior Treatment) 55 year old female presents with her husband with a cat bite that occurred this morning to her right hand.  She was playing with her cat when it bit her between her 2nd and 3rd fingers on her hand.  The area is now very red, swollen, warm with no discharge. She had cleaned the area with soap and water when it happened. She has not taken any pain medication or applied any topical meds to area.  She is concerned over the quick swelling and reaction to the bite. The cat's immunizations are up to date, including a current rabies vaccine.       Past Medical History:  Diagnosis Date  . Allergy   . Arthritis   . Osteoporosis    Past Surgical History:  Procedure Laterality Date  . ABDOMINAL HYSTERECTOMY    . BREAST SURGERY    . TUBAL LIGATION    . tumore removed  2008   Family History  Problem Relation Age of Onset  . Heart disease Father   . Diabetes Father    Social History  Substance Use Topics  . Smoking status: Former Games developermoker  . Smokeless tobacco: Never Used  . Alcohol use Yes   OB History    No data available     Review of Systems  Constitutional: Negative for fever.  Respiratory: Negative for shortness of breath.   Musculoskeletal: Positive for joint swelling.  Skin: Positive for color change and wound.  Neurological: Negative for numbness.    Allergies  Erythromycin and Iodine  Home Medications   Prior to Admission medications   Medication Sig Start Date End Date Taking? Authorizing Provider  albuterol (PROVENTIL HFA;VENTOLIN HFA) 108 (90 BASE) MCG/ACT inhaler Inhale 2 puffs into the lungs every 6 (six) hours as needed for wheezing or shortness of breath. 07/15/15   [name], MD    amoxicillin-clavulanate (AUGMENTIN) 875-125 MG tablet Take 1 tablet by mouth every 12 (twelve) hours. 03/25/16 04/04/16  Sudie GrumblingAnn Berry Sulaiman Imbert, NP  cetirizine (ZYRTEC) 10 MG tablet TAKE 1 TABLET BY MOUTH EVERY DAY 03/24/16   [name], MD  cyclobenzaprine (FLEXERIL) 10 MG tablet Take 1 tablet (10 mg total) by mouth at bedtime. To relax muscles 01/16/16   Tonye Pearsonobert P Doolittle, MD  diclofenac (VOLTAREN) 75 MG EC tablet Take 1 tablet (75 mg total) by mouth 2 (two) times daily. For jaw pain 01/16/16   Tonye Pearsonobert P Doolittle, MD  meloxicam (MOBIC) 15 MG tablet TAKE 1 TABLET(15 MG) BY MOUTH DAILY Patient not taking: Reported on 01/16/2016 12/21/15   Max T Al CorpusHyatt, DPM   Meds Ordered and Administered this Visit  Medications - No data to display  BP 168/95 (BP Location: Left Arm)   Pulse 86   Temp 99.3 F (37.4 C) (Oral)   Resp 18   SpO2 100%  No data found.   Physical Exam  Constitutional: She is oriented to person, place, and time. She appears well-developed and well-nourished. No distress.  Cardiovascular: Normal rate and regular rhythm.   Musculoskeletal: She exhibits tenderness.       Right hand: She exhibits decreased range of motion, tenderness and swelling.       Hands: 2 puncture marks in webbing between 2nd and 3rd finger of  right hand. No discharge from wound but redness and  swelling extends to mid-phalangeal joints of all fingers and almost to wrist. Area marked with skin pen. Very tender to palpation. Unable to make a fist due to swelling but otherwise has good range of motion.  Pulses are normal with good capillary refill. No numbness.   Neurological: She is alert and oriented to person, place, and time. No sensory deficit.  Skin: Skin is dry. Capillary refill takes less than 2 seconds. There is erythema.  Psychiatric: She has a normal mood and affect. Her behavior is normal. Judgment and thought content normal.    Urgent Care Course   Clinical Course    Procedures (including critical care  time)  Labs Review Labs Reviewed - No data to display  Imaging Review No results found.   Visual Acuity Review  Right Eye Distance:   Left Eye Distance:   Bilateral Distance:    Right Eye Near:   Left Eye Near:    Bilateral Near:         MDM   1. Cat bite, initial encounter    Discussed that 80 to 90% of cat bites get infected with a type of bacteria that can be difficult to treat in the outpatient setting. Recommend start Augmentin 875mg  twice a day for 10 days as directed. May take Ibuprofen 800mg  every 8 hours as needed for pain and swelling. Recommend apply cool compresses to area for comfort. Discussed monitoring wound area for additional swelling, redness, pain or discharge. If not improving within 48 hours, go to ER for further evaluation (sometimes cat bites need IV antibiotics). Patient and her husband express verbal understanding. Follow-up as planned.    Sudie GrumblingAnn Berry Javaya [place], NP 03/26/16 [phone]

## 2016-03-25 NOTE — ED Triage Notes (Signed)
[facility] with a complaint of a cat bite to height hand that occurred today. The patient  did have verification of a current rabies vaccination for the animal.

## 2016-03-25 NOTE — Discharge Instructions (Signed)
Start Augmentin twice a day as directed. Use ice and cool compresses to area to help with swelling. May take Aleve 2 tablets every 12 hours to help with pain and inflammation. Monitor very closely- if swelling and increased pain continues tomorrow, go to ER for further evaluation and possible IV antibiotics.

## 2016-07-16 ENCOUNTER — Other Ambulatory Visit: Payer: Self-pay | Admitting: Family Medicine

## 2016-07-26 ENCOUNTER — Other Ambulatory Visit: Payer: Self-pay | Admitting: Family Medicine

## 2016-08-13 ENCOUNTER — Other Ambulatory Visit: Payer: Self-pay | Admitting: Physician Assistant

## 2019-05-16 ENCOUNTER — Encounter: Payer: Self-pay | Admitting: Family Medicine

## 2019-05-16 ENCOUNTER — Ambulatory Visit (INDEPENDENT_AMBULATORY_CARE_PROVIDER_SITE_OTHER): Payer: 59 | Admitting: Family Medicine

## 2019-05-16 ENCOUNTER — Other Ambulatory Visit: Payer: Self-pay

## 2019-05-16 VITALS — BP 140/90 | HR 64 | Temp 98.3°F | Wt 145.2 lb

## 2019-05-16 DIAGNOSIS — Z131 Encounter for screening for diabetes mellitus: Secondary | ICD-10-CM

## 2019-05-16 DIAGNOSIS — Z1329 Encounter for screening for other suspected endocrine disorder: Secondary | ICD-10-CM

## 2019-05-16 DIAGNOSIS — I10 Essential (primary) hypertension: Secondary | ICD-10-CM

## 2019-05-16 DIAGNOSIS — F5104 Psychophysiologic insomnia: Secondary | ICD-10-CM

## 2019-05-16 DIAGNOSIS — Z23 Encounter for immunization: Secondary | ICD-10-CM

## 2019-05-16 DIAGNOSIS — Z13220 Encounter for screening for lipoid disorders: Secondary | ICD-10-CM

## 2019-05-16 DIAGNOSIS — F418 Other specified anxiety disorders: Secondary | ICD-10-CM

## 2019-05-16 DIAGNOSIS — M549 Dorsalgia, unspecified: Secondary | ICD-10-CM

## 2019-05-16 MED ORDER — AMLODIPINE BESYLATE 5 MG PO TABS: 5.0000 mg | ORAL_TABLET | Freq: Every day | ORAL | 1 refills | Status: DC

## 2019-05-16 MED ORDER — HYDROXYZINE HCL 25 MG PO TABS: 12.5000 mg | ORAL_TABLET | Freq: Every evening | ORAL | 0 refills | Status: DC | PRN

## 2019-05-16 NOTE — Progress Notes (Signed)
Subjective:    Patient ID: Jenny Davidson, female    DOB: 08/26/1960, 58 y.o.   MRN: [id number]  HPI Jenny Davidson is a 58 y.o. female Presents today for: Chief Complaint  Patient presents with   Establish Care    Here today to Est care. Have been having elevated blood pressure. Not sure id anxiety is the cause of my bp elevated GAD7=19   Back Pain    mid back pain. The feeling of needing to be popped feeling  New patient to me.  Most recently seen in June 2017 with Dr. Laney Pastor. No recent PCP visit.  Eye appt last Thursday. Did recommend blood sugar check as some change in vision. Plan for retina specialist eval.    Elevated Blood pressure: BP Readings from Last 3 Encounters:  05/16/19 (!) 180/98  03/25/16 168/95  01/16/16 (!) 144/94   No results found for: CREATININE No current meds. No prior meds. BP ok in past. Some anxiety going to doctor in past but usually ok outside.  Outside BP recently:  138/83, 129-134/75-93 range.  No new CP/dyspnea, occasional sinus HA. No weakness.  Takes allergy med - flonase ns, allegra. No decongestants.  Alcohol: rare 2-3 per month.  No tobacco.  Not fasting this am.   Anxiety:  Noted since Pandemic. Out of work for 3 weeks - [employer]. Opened up and concern with amount of people in store - worried about exposure to Covid-19.  Only rare anxiety/stress in past - situational. No prior meds or counseling.  Mom in assisted living - in apt in [place].  some isolation.  Current anxiety since March. Worse with going back to work in April.  Trouble with getting to sleep. No treatments.  No SI/HI. Mostly anxious , not depression.  Exercise: prior trail running. Walking at work, flow arts, walking at home depending on weather.  Late nights at work at times.   GAD 7 : Generalized Anxiety Score 05/16/2019  Nervous, Anxious, on Edge 2  Control/stop worrying 3  Worry too much - different things 3  Trouble relaxing 3  Restless 3  Easily annoyed  or irritable 2  Afraid - awful might happen 3  Total GAD 7 Score 19  Anxiety Difficulty Somewhat difficult   Depression screen [facility] 2/9 05/16/2019 01/16/2016 07/15/2015  Decreased Interest 0 0 0  Down, Depressed, Hopeless 0 0 0  PHQ - 2 Score 0 0 0    Mid back pain : Past few months. In middle, feel like needs to pop. No chest pain, no dyspnea, cough/nt sweats, fevers.  Less exercise d/t pandemic. No known injury.  Improves with massage/use of rubber ball to area.  No regular NSAIDS.  Patient Active Problem List   Diagnosis Date Noted   Seasonal allergies 12/09/2013   Osteopenia 12/09/2013   Past Medical History:  Diagnosis Date   Allergy    Arthritis    Osteoporosis    Past Surgical History:  Procedure Laterality Date   ABDOMINAL HYSTERECTOMY     BREAST SURGERY     TUBAL LIGATION     tumore removed  2008   Allergies  Allergen Reactions   Erythromycin Nausea And Vomiting   Iodine Rash   Prior to Admission medications   Medication Sig Start Date End Date Taking? Authorizing Provider  fexofenadine (ALLEGRA) 60 MG tablet Take 60 mg by mouth 2 (two) times daily.   Yes [provider]   Social History   Socioeconomic History   Marital  status: Married    Spouse name: Not on file   Number of children: Not on file   Years of education: Not on file   Highest education level: Not on file  Occupational History   Not on file  Social Needs   Financial resource strain: Not on file   Food insecurity    Worry: Not on file    Inability: Not on file   Transportation needs    Medical: Not on file    Non-medical: Not on file  Tobacco Use   Smoking status: Former Smoker   Smokeless tobacco: Never Used  Substance and Sexual Activity   Alcohol use: Yes   Drug use: Not on file   Sexual activity: Not on file  Lifestyle   Physical activity    Days per week: Not on file    Minutes per session: Not on file   Stress: Not on file    Relationships   Social connections    Talks on phone: Not on file    Gets together: Not on file    Attends religious service: Not on file    Active member of club or organization: Not on file    Attends meetings of clubs or organizations: Not on file    Relationship status: Not on file   Intimate partner violence    Fear of current or ex partner: Not on file    Emotionally abused: Not on file    Physically abused: Not on file    Forced sexual activity: Not on file  Other Topics Concern   Not on file  Social History Narrative   Not on file    Review of Systems  Constitutional: Negative for fatigue and unexpected weight change.  Respiratory: Negative for chest tightness and shortness of breath.   Cardiovascular: Negative for chest pain, palpitations and leg swelling.  Gastrointestinal: Negative for abdominal pain and blood in stool.  Neurological: Negative for dizziness, syncope, light-headedness and headaches.       Objective:   Physical Exam Vitals:   05/16/19 1111 05/16/19 1135 05/16/19 1136  BP: (!) 180/98 (!) 165/102 140/90  Pulse: 64    Temp: 98.3 F (36.8 C)    TempSrc: Oral    SpO2: 98%    Weight: 145 lb 3.2 oz (65.9 kg)          Assessment & Plan:    Jenny Davidson is a 58 y.o. female Essential hypertension - Plan: Comprehensive metabolic panel, amLODipine (NORVASC) 5 MG tablet, DISCONTINUED: amLODipine (NORVASC) 5 MG tablet  -Stage I HTN based on current numbers.  Elevated further in office.  May have component of whitecoat hypertension.  Start amlodipine 5 mg daily, labs pending, recheck 1 month.  Need for prophylactic vaccination and inoculation against influenza - Plan: Flu Vaccine QUAD 6+ mos PF IM (Fluarix Quad PF)  Need for prophylactic vaccination with combined diphtheria-tetanus-pertussis (DTP) vaccine - Plan: Tdap vaccine greater than or equal to 7yo IM  Screening for diabetes mellitus - Plan: Comprehensive metabolic panel, Hemoglobin  A1c  Screening for hyperlipidemia - Plan: Lipid panel  Screening for thyroid disorder - Plan: TSH  Situational anxiety  -Possible component of generalized anxiety that has been managed with worsening symptoms since start of pandemic.  Situational stressors/work stressors.  Treatment options discussed.   -Deferred SSRI at this time, initially will try coping techniques, counseling discussed and phone number provided.  -Hydroxyzine as needed for sleep.  -Handout provided for stress and anxiety.  -Recheck  1 month.  Sooner if worse  Mid back pain  -Diffuse, suspected spasm/muscular cause potentially with stressors.  No red flags on exam/history.  No chest symptoms.  No respiratory symptoms.  No injury, no focal bony tenderness, unlikely compression fracture.  -Symptomatic care with massage, heat, gentle range of motion stretches with recheck in 1 month, sooner if worsening Meds ordered this encounter  Medications   DISCONTD: amLODipine (NORVASC) 5 MG tablet    Sig: Take 1 tablet (5 mg total) by mouth daily.    Dispense:  90 tablet    Refill:  1   amLODipine (NORVASC) 5 MG tablet    Sig: Take 1 tablet (5 mg total) by mouth daily.    Dispense:  90 tablet    Refill:  1   Patient Instructions   Melatonin for sleep.  Hydroxyzine if needed for sleep. See info on stress below and insomnia. I do recommend meeting with therapist. See number below. We can consider daily med if needed as well. Recheck in 1 month.  Start amlodipine for blood pressure. Recheck 1 month. I will check some blood tests as well.   Back pain appears to be in the muscles.  Sleep, stress/anxiety treatment should help.  Tylenol if needed.  Stretches, range of motion, heat to area as needed.  Recheck in 3 weeks  Return to the clinic or go to the nearest emergency room if any of your symptoms worsen or new symptoms occur.  [facility]: [phone]    Hypertension, Adult High blood pressure  (hypertension) is when the force of blood pumping through the arteries is too strong. The arteries are the blood vessels that carry blood from the heart throughout the body. Hypertension forces the heart to work harder to pump blood and may cause arteries to become narrow or stiff. Untreated or uncontrolled hypertension can cause a heart attack, heart failure, a stroke, kidney disease, and other problems. A blood pressure reading consists of a higher number over a lower number. Ideally, your blood pressure should be below 120/80. The first ("top") number is called the systolic pressure. It is a measure of the pressure in your arteries as your heart beats. The second ("bottom") number is called the diastolic pressure. It is a measure of the pressure in your arteries as the heart relaxes. What are the causes? The exact cause of this condition is not known. There are some conditions that result in or are related to high blood pressure. What increases the risk? Some risk factors for high blood pressure are under your control. The following factors may make you more likely to develop this condition:  Smoking.  Having type 2 diabetes mellitus, high cholesterol, or both.  Not getting enough exercise or physical activity.  Being overweight.  Having too much fat, sugar, calories, or salt (sodium) in your diet.  Drinking too much alcohol. Some risk factors for high blood pressure may be difficult or impossible to change. Some of these factors include:  Having chronic kidney disease.  Having a family history of high blood pressure.  Age. Risk increases with age.  Race. You may be at higher risk if you are African American.  Gender. Men are at higher risk than women before age 58. After age 41, women are at higher risk than men.  Having obstructive sleep apnea.  Stress. What are the signs or symptoms? High blood pressure may not cause symptoms. Very high blood pressure (hypertensive crisis) may  cause:  Headache.  Anxiety.  Shortness of breath.  Nosebleed.  Nausea and vomiting.  Vision changes.  Severe chest pain.  Seizures. How is this diagnosed? This condition is diagnosed by measuring your blood pressure while you are seated, with your arm resting on a flat surface, your legs uncrossed, and your feet flat on the floor. The cuff of the blood pressure monitor will be placed directly against the skin of your upper arm at the level of your heart. It should be measured at least twice using the same arm. Certain conditions can cause a difference in blood pressure between your right and left arms. Certain factors can cause blood pressure readings to be lower or higher than normal for a short period of time:  When your blood pressure is higher when you are in a health care provider's office than when you are at home, this is called white coat hypertension. Most people with this condition do not need medicines.  When your blood pressure is higher at home than when you are in a health care provider's office, this is called masked hypertension. Most people with this condition may need medicines to control blood pressure. If you have a high blood pressure reading during one visit or you have normal blood pressure with other risk factors, you may be asked to:  Return on a different day to have your blood pressure checked again.  Monitor your blood pressure at home for 1 week or longer. If you are diagnosed with hypertension, you may have other blood or imaging tests to help your health care provider understand your overall risk for other conditions. How is this treated? This condition is treated by making healthy lifestyle changes, such as eating healthy foods, exercising more, and reducing your alcohol intake. Your health care provider may prescribe medicine if lifestyle changes are not enough to get your blood pressure under control, and if:  Your systolic blood pressure is above  130.  Your diastolic blood pressure is above 80. Your personal target blood pressure may vary depending on your medical conditions, your age, and other factors. Follow these instructions at home: Eating and drinking   Eat a diet that is high in fiber and potassium, and low in sodium, added sugar, and fat. An example eating plan is called the DASH (Dietary Approaches to Stop Hypertension) diet. To eat this way: ? Eat plenty of fresh fruits and vegetables. Try to fill one half of your plate at each meal with fruits and vegetables. ? Eat whole grains, such as whole-wheat pasta, brown rice, or whole-grain bread. Fill about one fourth of your plate with whole grains. ? Eat or drink low-fat dairy products, such as skim milk or low-fat yogurt. ? Avoid fatty cuts of meat, processed or cured meats, and poultry with skin. Fill about one fourth of your plate with lean proteins, such as fish, chicken without skin, beans, eggs, or tofu. ? Avoid pre-made and processed foods. These tend to be higher in sodium, added sugar, and fat.  Reduce your daily sodium intake. Most people with hypertension should eat less than 1,500 mg of sodium a day.  Do not drink alcohol if: ? Your health care provider tells you not to drink. ? You are pregnant, may be pregnant, or are planning to become pregnant.  If you drink alcohol: ? Limit how much you use to:  0-1 drink a day for women.  0-2 drinks a day for men. ? Be aware of how much alcohol is in your drink.  In the [place] one drink equals one 12 oz bottle of beer (355 mL), one 5 oz glass of wine (148 mL), or one 1 oz glass of hard liquor (44 mL). Lifestyle   Work with your health care provider to maintain a healthy body weight or to lose weight. Ask what an ideal weight is for you.  Get at least 30 minutes of exercise most days of the week. Activities may include walking, swimming, or biking.  Include exercise to strengthen your muscles (resistance exercise),  such as Pilates or lifting weights, as part of your weekly exercise routine. Try to do these types of exercises for 30 minutes at least 3 days a week.  Do not use any products that contain nicotine or tobacco, such as cigarettes, e-cigarettes, and chewing tobacco. If you need help quitting, ask your health care provider.  Monitor your blood pressure at home as told by your health care provider.  Keep all follow-up visits as told by your health care provider. This is important. Medicines  Take over-the-counter and prescription medicines only as told by your health care provider. Follow directions carefully. Blood pressure medicines must be taken as prescribed.  Do not skip doses of blood pressure medicine. Doing this puts you at risk for problems and can make the medicine less effective.  Ask your health care provider about side effects or reactions to medicines that you should watch for. Contact a health care provider if you:  Think you are having a reaction to a medicine you are taking.  Have headaches that keep coming back (recurring).  Feel dizzy.  Have swelling in your ankles.  Have trouble with your vision. Get help right away if you:  Develop a severe headache or confusion.  Have unusual weakness or numbness.  Feel faint.  Have severe pain in your chest or abdomen.  Vomit repeatedly.  Have trouble breathing. Summary  Hypertension is when the force of blood pumping through your arteries is too strong. If this condition is not controlled, it may put you at risk for serious complications.  Your personal target blood pressure may vary depending on your medical conditions, your age, and other factors. For most people, a normal blood pressure is less than 120/80.  Hypertension is treated with lifestyle changes, medicines, or a combination of both. Lifestyle changes include losing weight, eating a healthy, low-sodium diet, exercising more, and limiting alcohol. This  information is not intended to replace advice given to you by your health care provider. Make sure you discuss any questions you have with your health care provider. Document Released: 07/28/2005 Document Revised: 04/07/2018 Document Reviewed: 04/07/2018 Elsevier Patient Education  2020 [place]  After being diagnosed with an anxiety disorder, you may be relieved to know why you have felt or behaved a certain way. It is natural to also feel overwhelmed about the treatment ahead and what it will mean for your life. With care and support, you can manage this condition and recover from it. How to cope with anxiety Dealing with stress Stress is your bodys reaction to life changes and events, both good and bad. Stress can last just a few hours or it can be ongoing. Stress can play a major role in anxiety, so it is important to learn both how to cope with stress and how to think about it differently. Talk with your health care provider or a counselor to learn more about stress reduction. He or she may suggest some  stress reduction techniques, such as:  Music therapy. This can include creating or listening to music that you enjoy and that inspires you.  Mindfulness-based meditation. This involves being aware of your normal breaths, rather than trying to control your breathing. It can be done while sitting or walking.  Centering prayer. This is a kind of meditation that involves focusing on a word, phrase, or sacred image that is meaningful to you and that brings you peace.  Deep breathing. To do this, expand your stomach and inhale slowly through your nose. Hold your breath for 3-5 seconds. Then exhale slowly, allowing your stomach muscles to relax.  Self-talk. This is a skill where you identify thought patterns that lead to anxiety reactions and correct those thoughts.  Muscle relaxation. This involves tensing muscles then relaxing them. Choose a stress reduction  technique that fits your lifestyle and personality. Stress reduction techniques take time and practice. Set aside 5-15 minutes a day to do them. Therapists can offer training in these techniques. The training may be covered by some insurance plans. Other things you can do to manage stress include:  Keeping a stress diary. This can help you learn what triggers your stress and ways to control your response.  Thinking about how you respond to certain situations. You may not be able to control everything, but you can control your reaction.  Making time for activities that help you relax, and not feeling guilty about spending your time in this way. Therapy combined with coping and stress-reduction skills provides the best chance for successful treatment. Medicines Medicines can help ease symptoms. Medicines for anxiety include:  Anti-anxiety drugs.  Antidepressants.  Beta-blockers. Medicines may be used as the main treatment for anxiety disorder, along with therapy, or if other treatments are not working. Medicines should be prescribed by a health care provider. Relationships Relationships can play a big part in helping you recover. Try to spend more time connecting with trusted friends and family members. Consider going to couples counseling, taking family education classes, or going to family therapy. Therapy can help you and others better understand the condition. How to recognize changes in your condition Everyone has a different response to treatment for anxiety. Recovery from anxiety happens when symptoms decrease and stop interfering with your daily activities at home or work. This may mean that you will start to:  Have better concentration and focus.  Sleep better.  Be less irritable.  Have more energy.  Have improved memory. It is important to recognize when your condition is getting worse. Contact your health care provider if your symptoms interfere with home or work and you do  not feel like your condition is improving. Where to find help and support: You can get help and support from these sources:  Self-help groups.  Online and [employer].  A trusted spiritual leader.  Couples counseling.  Family education classes.  Family therapy. Follow these instructions at home:  Eat a healthy diet that includes plenty of vegetables, fruits, whole grains, low-fat dairy products, and lean protein. Do not eat a lot of foods that are high in solid fats, added sugars, or salt.  Exercise. Most adults should do the following: ? Exercise for at least 150 minutes each week. The exercise should increase your heart rate and make you sweat (moderate-intensity exercise). ? Strengthening exercises at least twice a week.  Cut down on caffeine, tobacco, alcohol, and other potentially harmful substances.  Get the right amount and quality of sleep. Most adults  need 7-9 hours of sleep each night.  Make choices that simplify your life.  Take over-the-counter and prescription medicines only as told by your health care provider.  Avoid caffeine, alcohol, and certain over-the-counter cold medicines. These may make you feel worse. Ask your pharmacist which medicines to avoid.  Keep all follow-up visits as told by your health care provider. This is important. Questions to ask your health care provider  Would I benefit from therapy?  How often should I follow up with a health care provider?  How long do I need to take medicine?  Are there any long-term side effects of my medicine?  Are there any alternatives to taking medicine? Contact a health care provider if:  You have a hard time staying focused or finishing daily tasks.  You spend many hours a day feeling worried about everyday life.  You become exhausted by worry.  You start to have headaches, feel tense, or have nausea.  You urinate more than normal.  You have diarrhea. Get help right away  if:  You have a racing heart and shortness of breath.  You have thoughts of hurting yourself or others. If you ever feel like you may hurt yourself or others, or have thoughts about taking your own life, get help right away. You can go to your nearest emergency department or call:  Your local emergency services (911 in the [place]  A suicide crisis helpline, such as the [facility] at 712-372-1937. This is open 24-hours a day. Summary  Taking steps to deal with stress can help calm you.  Medicines cannot cure anxiety disorders, but they can help ease symptoms.  Family, friends, and partners can play a big part in helping you recover from an anxiety disorder. This information is not intended to replace advice given to you by your health care provider. Make sure you discuss any questions you have with your health care provider. Document Released: 07/22/2016 Document Revised: 07/10/2017 Document Reviewed: 07/22/2016 Elsevier Patient Education  2020 [place].  Insomnia Insomnia is a sleep disorder that makes it difficult to fall asleep or stay asleep. Insomnia can cause fatigue, low energy, difficulty concentrating, mood swings, and poor performance at work or school. There are three different ways to classify insomnia:  Difficulty falling asleep.  Difficulty staying asleep.  Waking up too early in the morning. Any type of insomnia can be long-term (chronic) or short-term (acute). Both are common. Short-term insomnia usually lasts for three months or less. Chronic insomnia occurs at least three times a week for longer than three months. What are the causes? Insomnia may be caused by another condition, situation, or substance, such as:  Anxiety.  Certain medicines.  Gastroesophageal reflux disease (GERD) or other gastrointestinal conditions.  Asthma or other breathing conditions.  Restless legs syndrome, sleep apnea, or other sleep  disorders.  Chronic pain.  Menopause.  Stroke.  Abuse of alcohol, tobacco, or illegal drugs.  Mental health conditions, such as depression.  Caffeine.  Neurological disorders, such as Alzheimer's disease.  An overactive thyroid (hyperthyroidism). Sometimes, the cause of insomnia may not be known. What increases the risk? Risk factors for insomnia include:  Gender. Women are affected more often than men.  Age. Insomnia is more common as you get older.  Stress.  Lack of exercise.  Irregular work schedule or working night shifts.  Traveling between different time zones.  Certain medical and mental health conditions. What are the signs or symptoms? If you have insomnia,  the main symptom is having trouble falling asleep or having trouble staying asleep. This may lead to other symptoms, such as:  Feeling fatigued or having low energy.  Feeling nervous about going to sleep.  Not feeling rested in the morning.  Having trouble concentrating.  Feeling irritable, anxious, or depressed. How is this diagnosed? This condition may be diagnosed based on:  Your symptoms and medical history. Your health care provider may ask about: ? Your sleep habits. ? Any medical conditions you have. ? Your mental health.  A physical exam. How is this treated? Treatment for insomnia depends on the cause. Treatment may focus on treating an underlying condition that is causing insomnia. Treatment may also include:  Medicines to help you sleep.  Counseling or therapy.  Lifestyle adjustments to help you sleep better. Follow these instructions at home: Eating and drinking   Limit or avoid alcohol, caffeinated beverages, and cigarettes, especially close to bedtime. These can disrupt your sleep.  Do not eat a large meal or eat spicy foods right before bedtime. This can lead to digestive discomfort that can make it hard for you to sleep. Sleep habits   Keep a sleep diary to help you  and your health care provider figure out what could be causing your insomnia. Write down: ? When you sleep. ? When you wake up during the night. ? How well you sleep. ? How rested you feel the next day. ? Any side effects of medicines you are taking. ? What you eat and drink.  Make your bedroom a dark, comfortable place where it is easy to fall asleep. ? Put up shades or blackout curtains to block light from outside. ? Use a white noise machine to block noise. ? Keep the temperature cool.  Limit screen use before bedtime. This includes: ? Watching TV. ? Using your smartphone, tablet, or computer.  Stick to a routine that includes going to bed and waking up at the same times every day and night. This can help you fall asleep faster. Consider making a quiet activity, such as reading, part of your nighttime routine.  Try to avoid taking naps during the day so that you sleep better at night.  Get out of bed if you are still awake after 15 minutes of trying to sleep. Keep the lights down, but try reading or doing a quiet activity. When you feel sleepy, go back to bed. General instructions  Take over-the-counter and prescription medicines only as told by your health care provider.  Exercise regularly, as told by your health care provider. Avoid exercise starting several hours before bedtime.  Use relaxation techniques to manage stress. Ask your health care provider to suggest some techniques that may work well for you. These may include: ? Breathing exercises. ? Routines to release muscle tension. ? Visualizing peaceful scenes.  Make sure that you drive carefully. Avoid driving if you feel very sleepy.  Keep all follow-up visits as told by your health care provider. This is important. Contact a health care provider if:  You are tired throughout the day.  You have trouble in your daily routine due to sleepiness.  You continue to have sleep problems, or your sleep problems get  worse. Get help right away if:  You have serious thoughts about hurting yourself or someone else. If you ever feel like you may hurt yourself or others, or have thoughts about taking your own life, get help right away. You can go to your nearest emergency department  or call:  Your local emergency services (911 in the [place]  A suicide crisis helpline, such as the [facility] at (515) 451-4801. This is open 24 hours a day. Summary  Insomnia is a sleep disorder that makes it difficult to fall asleep or stay asleep.  Insomnia can be long-term (chronic) or short-term (acute).  Treatment for insomnia depends on the cause. Treatment may focus on treating an underlying condition that is causing insomnia.  Keep a sleep diary to help you and your health care provider figure out what could be causing your insomnia. This information is not intended to replace advice given to you by your health care provider. Make sure you discuss any questions you have with your health care provider. Document Released: 07/25/2000 Document Revised: 07/10/2017 Document Reviewed: 05/07/2017 Elsevier Patient Education  2020 [place] is a normal reaction to life events. Stress is what you feel when life demands more than you are used to, or more than you think you can handle. Some stress can be useful, such as studying for a test or meeting a deadline at work. Stress that occurs too often or for too long can cause problems. It can affect your emotional health and interfere with relationships and normal daily activities. Too much stress can weaken your body's defense system (immune system) and increase your risk for physical illness. If you already have a medical problem, stress can make it worse. What are the causes? All sorts of life events can cause stress. An event that causes stress for one person may not be stressful for another person. Major life events, whether positive  or negative, commonly cause stress. Examples include:  Losing a job or starting a new job.  Losing a loved one.  Moving to a new town or home.  Getting married or divorced.  Having a baby.  Injury or illness. Less obvious life events can also cause stress, especially if they occur day after day or in combination with each other. Examples include:  Working long hours.  Driving in traffic.  Caring for children.  Being in debt.  Being in a difficult relationship. What are the signs or symptoms? Stress can cause emotional symptoms, including:  Anxiety. This is feeling worried, afraid, on edge, overwhelmed, or out of control.  Anger, including irritation or impatience.  Depression. This is feeling sad, down, helpless, or guilty.  Trouble focusing, remembering, or making decisions. Stress can cause physical symptoms, including:  Aches and pains. These may affect your head, neck, back, stomach, or other areas of your body.  Tight muscles or a clenched jaw.  Low energy.  Trouble sleeping. Stress can cause unhealthy behaviors, including:  Eating to feel better (overeating) or skipping meals.  Working too much or putting off tasks.  Smoking, drinking alcohol, or using drugs to feel better. How is this diagnosed? Stress is diagnosed through an assessment by your health care provider. He or she may diagnose this condition based on:  Your symptoms and any stressful life events.  Your medical history.  Tests to rule out other causes of your symptoms. Depending on your condition, your health care provider may refer you to a specialist for further evaluation. How is this treated?  Stress management techniques are the recommended treatment for stress. Medicine is not typically recommended for the treatment of stress. Techniques to reduce your reaction to stressful life events include:  Stress identification. Monitor yourself for symptoms of stress and identify what  causes stress  for you. These skills may help you to avoid or prepare for stressful events.  Time management. Set your priorities, keep a calendar of events, and learn to say no. Taking these actions can help you avoid making too many commitments. Techniques for coping with stress include:  Rethinking the problem. Try to think realistically about stressful events rather than ignoring them or overreacting. Try to find the positives in a stressful situation rather than focusing on the negatives.  Exercise. Physical exercise can release both physical and emotional tension. The key is to find a form of exercise that you enjoy and do it regularly.  Relaxation techniques. These relax the body and mind. The key is to find one or more that you enjoy and use the technique(s) regularly. Examples include: ? Meditation, deep breathing, or progressive relaxation techniques. ? Yoga or tai chi. ? Biofeedback, mindfulness techniques, or journaling. ? Listening to music, being out in nature, or participating in other hobbies.  Practicing a healthy lifestyle. Eat a balanced diet, drink plenty of water, limit or avoid caffeine, and get plenty of sleep.  Having a strong support network. Spend time with family, friends, or other people you enjoy being around. Express your feelings and talk things over with someone you trust. Counseling or talk therapy with a mental health professional may be helpful if you are having trouble managing stress on your own. Follow these instructions at home: Lifestyle   Avoid drugs.  Do not use any products that contain nicotine or tobacco, such as cigarettes and e-cigarettes. If you need help quitting, ask your health care provider.  Limit alcohol intake to no more than 1 drink a day for nonpregnant women and 2 drinks a day for men. One drink equals 12 oz of beer, 5 oz of wine, or 1 oz of hard liquor.  Do not use alcohol or drugs to relax.  Eat a balanced diet that includes  fresh fruits and vegetables, whole grains, lean meats, fish, eggs, and beans, and low-fat dairy. Avoid processed foods and foods high in added fat, sugar, and salt.  Exercise at least 30 minutes on 5 or more days each week.  Get 7-8 hours of sleep each night. General instructions   Practice stress management techniques as discussed with your health care provider.  Drink enough fluid to keep your urine clear or pale yellow.  Take over-the-counter and prescription medicines only as told by your health care provider.  Keep all follow-up visits as told by your health care provider. This is important. Contact a health care provider if:  Your symptoms get worse.  You have new symptoms.  You feel overwhelmed by your problems and can no longer manage them on your own. Get help right away if:  You have thoughts of hurting yourself or others. If you ever feel like you may hurt yourself or others, or have thoughts about taking your own life, get help right away. You can go to your nearest emergency department or call:  Your local emergency services (911 in the [place]  A suicide crisis helpline, such as the [place] at 910-582-4538. This is open 24 hours a day. Summary  Stress is a normal reaction to life events. It can cause problems if it happens too often or for too long.  Practicing stress management techniques is the best way to treat stress.  Counseling or talk therapy with a mental health professional may be helpful if you are having trouble managing stress on your  own. This information is not intended to replace advice given to you by your health care provider. Make sure you discuss any questions you have with your health care provider. Document Released: 01/21/2001 Document Revised: 07/10/2017 Document Reviewed: 09/17/2016 Elsevier Patient Education  2020 [employer].    How to Take Your Blood Pressure You can take your blood pressure at home  with a machine. You may need to check your blood pressure at home:  To check if you have high blood pressure (hypertension).  To check your blood pressure over time.  To make sure your blood pressure medicine is working. Supplies needed: You will need a blood pressure machine, or monitor. You can buy one at a drugstore or online. When choosing one:  Choose one with an arm cuff.  Choose one that wraps around your upper arm. Only one finger should fit between your arm and the cuff.  Do not choose one that measures your blood pressure from your wrist or finger. Your doctor can suggest a monitor. How to prepare Avoid these things for 30 minutes before checking your blood pressure:  Drinking caffeine.  Drinking alcohol.  Eating.  Smoking.  Exercising. Five minutes before checking your blood pressure:  Pee.  Sit in a dining chair. Avoid sitting in a soft couch or armchair.  Be quiet. Do not talk. How to take your blood pressure Follow the instructions that came with your machine. If you have a digital blood pressure monitor, these may be the instructions: 1. Sit up straight. 2. Place your feet on the floor. Do not cross your ankles or legs. 3. Rest your left arm at the level of your heart. You may rest it on a table, desk, or chair. 4. Pull up your shirt sleeve. 5. Wrap the blood pressure cuff around the upper part of your left arm. The cuff should be 1 inch (2.5 cm) above your elbow. It is best to wrap the cuff around bare skin. 6. Fit the cuff snugly around your arm. You should be able to place only one finger between the cuff and your arm. 7. Put the cord inside the groove of your elbow. 8. Press the power button. 9. Sit quietly while the cuff fills with air and loses air. 10. Write down the numbers on the screen. 11. Wait 2-3 minutes and then repeat steps 1-10. What do the numbers mean? Two numbers make up your blood pressure. The first number is called systolic  pressure. The second is called diastolic pressure. An example of a blood pressure reading is "120 over 80" (or 120/80). If you are an adult and do not have a medical condition, use this guide to find out if your blood pressure is normal: Normal  First number: below 120.  Second number: below 80. Elevated  First number: 120-129.  Second number: below 80. Hypertension stage 1  First number: 130-139.  Second number: 80-89. Hypertension stage 2  First number: 140 or above.  Second number: 34 or above. Your blood pressure is above normal even if only the top or bottom number is above normal. Follow these instructions at home:  Check your blood pressure as often as your doctor tells you to.  Take your monitor to your next doctor's appointment. Your doctor will: ? Make sure you are using it correctly. ? Make sure it is working right.  Make sure you understand what your blood pressure numbers should be.  Tell your doctor if your medicines are causing side effects.  Contact a doctor if:  Your blood pressure keeps being high. Get help right away if:  Your first blood pressure number is higher than 180.  Your second blood pressure number is higher than 120. This information is not intended to replace advice given to you by your health care provider. Make sure you discuss any questions you have with your health care provider. Document Released: 07/10/2008 Document Revised: 07/10/2017 Document Reviewed: 01/04/2016 Elsevier Patient Education  2020 [place].    Acute Back Pain, Adult Acute back pain is sudden and usually short-lived. It is often caused by an injury to the muscles and tissues in the back. The injury may result from:  A muscle or ligament getting overstretched or torn (strained). Ligaments are tissues that connect bones to each other. Lifting something improperly can cause a back strain.  Wear and tear (degeneration) of the spinal disks. Spinal disks are  circular tissue that provides cushioning between the bones of the spine (vertebrae).  Twisting motions, such as while playing sports or doing yard work.  A hit to the back.  Arthritis. You may have a physical exam, lab tests, and imaging tests to find the cause of your pain. Acute back pain usually goes away with rest and home care. Follow these instructions at home: Managing pain, stiffness, and swelling  Take over-the-counter and prescription medicines only as told by your health care provider.  Your health care provider may recommend applying ice during the first 24-48 hours after your pain starts. To do this: ? Put ice in a plastic bag. ? Place a towel between your skin and the bag. ? Leave the ice on for 20 minutes, 2-3 times a day.  If directed, apply heat to the affected area as often as told by your health care provider. Use the heat source that your health care provider recommends, such as a moist heat pack or a heating pad. ? Place a towel between your skin and the heat source. ? Leave the heat on for 20-30 minutes. ? Remove the heat if your skin turns bright red. This is especially important if you are unable to feel pain, heat, or cold. You have a greater risk of getting burned. Activity   Do not stay in bed. Staying in bed for more than 1-2 days can delay your recovery.  Sit up and stand up straight. Avoid leaning forward when you sit, or hunching over when you stand. ? If you work at a desk, sit close to it so you do not need to lean over. Keep your chin tucked in. Keep your neck drawn back, and keep your elbows bent at a right angle. Your arms should look like the letter "L." ? Sit high and close to the steering wheel when you drive. Add lower back (lumbar) support to your car seat, if needed.  Take short walks on even surfaces as soon as you are able. Try to increase the length of time you walk each day.  Do not sit, drive, or stand in one place for more than 30  minutes at a time. Sitting or standing for long periods of time can put stress on your back.  Do not drive or use heavy machinery while taking prescription pain medicine.  Use proper lifting techniques. When you bend and lift, use positions that put less stress on your back: ? [place] your knees. ? Keep the load close to your body. ? Avoid twisting.  Exercise regularly as told by your health  care provider. Exercising helps your back heal faster and helps prevent back injuries by keeping muscles strong and flexible.  Work with a physical therapist to make a safe exercise program, as recommended by your health care provider. Do any exercises as told by your physical therapist. Lifestyle  Maintain a healthy weight. Extra weight puts stress on your back and makes it difficult to have good posture.  Avoid activities or situations that make you feel anxious or stressed. Stress and anxiety increase muscle tension and can make back pain worse. Learn ways to manage anxiety and stress, such as through exercise. General instructions  Sleep on a firm mattress in a comfortable position. Try lying on your side with your knees slightly bent. If you lie on your back, put a pillow under your knees.  Follow your treatment plan as told by your health care provider. This may include: ? Cognitive or behavioral therapy. ? Acupuncture or massage therapy. ? Meditation or yoga. Contact a health care provider if:  You have pain that is not relieved with rest or medicine.  You have increasing pain going down into your legs or buttocks.  Your pain does not improve after 2 weeks.  You have pain at night.  You lose weight without trying.  You have a fever or chills. Get help right away if:  You develop new bowel or bladder control problems.  You have unusual weakness or numbness in your arms or legs.  You develop nausea or vomiting.  You develop abdominal pain.  You feel faint. Summary  Acute back  pain is sudden and usually short-lived.  Use proper lifting techniques. When you bend and lift, use positions that put less stress on your back.  Take over-the-counter and prescription medicines and apply heat or ice as directed by your health care provider. This information is not intended to replace advice given to you by your health care provider. Make sure you discuss any questions you have with your health care provider. Document Released: 07/28/2005 Document Revised: 11/16/2018 Document Reviewed: 03/11/2017 Elsevier Patient Education  [employer].   If you have lab work done today you will be contacted with your lab results within the next 2 weeks.  If you have not heard from [place] then please contact us. The fastest way to get your results is to register for My Chart.   IF you received an x-ray today, you will receive an invoice from [facility]. Please contact [facility] at [phone] with questions or concerns regarding your invoice.   IF you received labwork today, you will receive an invoice from [facility]. Please contact [facility] at [phone] with questions or concerns regarding your invoice.   Our billing staff will not be able to assist you with questions regarding bills from these companies.  You will be contacted with the lab results as soon as they are available. The fastest way to get your results is to activate your My Chart account. Instructions are located on the last page of this paperwork. If you have not heard from [place] regarding the results in 2 weeks, please contact this office.       Signed,   Merri Ray, MD [facility].  05/16/19 1:43 PM

## 2019-05-16 NOTE — Patient Instructions (Addendum)
Melatonin for sleep.  Hydroxyzine if needed for sleep. See info on stress below and insomnia. I do recommend meeting with therapist. See number below. We can consider daily med if needed as well. Recheck in 1 month.  Start amlodipine for blood pressure. Recheck 1 month. I will check some blood tests as well.   Back pain appears to be in the muscles.  Sleep, stress/anxiety treatment should help.  Tylenol if needed.  Stretches, range of motion, heat to area as needed.  Recheck in 3 weeks  Return to the clinic or go to the nearest emergency room if any of your symptoms worsen or new symptoms occur.  [facility]: [phone]    Hypertension, Adult High blood pressure (hypertension) is when the force of blood pumping through the arteries is too strong. The arteries are the blood vessels that carry blood from the heart throughout the body. Hypertension forces the heart to work harder to pump blood and may cause arteries to become narrow or stiff. Untreated or uncontrolled hypertension can cause a heart attack, heart failure, a stroke, kidney disease, and other problems. A blood pressure reading consists of a higher number over a lower number. Ideally, your blood pressure should be below 120/80. The first ("top") number is called the systolic pressure. It is a measure of the pressure in your arteries as your heart beats. The second ("bottom") number is called the diastolic pressure. It is a measure of the pressure in your arteries as the heart relaxes. What are the causes? The exact cause of this condition is not known. There are some conditions that result in or are related to high blood pressure. What increases the risk? Some risk factors for high blood pressure are under your control. The following factors may make you more likely to develop this condition:  Smoking.  Having type 2 diabetes mellitus, high cholesterol, or both.  Not getting enough exercise or physical  activity.  Being overweight.  Having too much fat, sugar, calories, or salt (sodium) in your diet.  Drinking too much alcohol. Some risk factors for high blood pressure may be difficult or impossible to change. Some of these factors include:  Having chronic kidney disease.  Having a family history of high blood pressure.  Age. Risk increases with age.  Race. You may be at higher risk if you are African American.  Gender. Men are at higher risk than women before age 16. After age 70, women are at higher risk than men.  Having obstructive sleep apnea.  Stress. What are the signs or symptoms? High blood pressure may not cause symptoms. Very high blood pressure (hypertensive crisis) may cause:  Headache.  Anxiety.  Shortness of breath.  Nosebleed.  Nausea and vomiting.  Vision changes.  Severe chest pain.  Seizures. How is this diagnosed? This condition is diagnosed by measuring your blood pressure while you are seated, with your arm resting on a flat surface, your legs uncrossed, and your feet flat on the floor. The cuff of the blood pressure monitor will be placed directly against the skin of your upper arm at the level of your heart. It should be measured at least twice using the same arm. Certain conditions can cause a difference in blood pressure between your right and left arms. Certain factors can cause blood pressure readings to be lower or higher than normal for a short period of time:  When your blood pressure is higher when you are in a health care provider's office  than when you are at home, this is called white coat hypertension. Most people with this condition do not need medicines.  When your blood pressure is higher at home than when you are in a health care provider's office, this is called masked hypertension. Most people with this condition may need medicines to control blood pressure. If you have a high blood pressure reading during one visit or you have  normal blood pressure with other risk factors, you may be asked to:  Return on a different day to have your blood pressure checked again.  Monitor your blood pressure at home for 1 week or longer. If you are diagnosed with hypertension, you may have other blood or imaging tests to help your health care provider understand your overall risk for other conditions. How is this treated? This condition is treated by making healthy lifestyle changes, such as eating healthy foods, exercising more, and reducing your alcohol intake. Your health care provider may prescribe medicine if lifestyle changes are not enough to get your blood pressure under control, and if:  Your systolic blood pressure is above 130.  Your diastolic blood pressure is above 80. Your personal target blood pressure may vary depending on your medical conditions, your age, and other factors. Follow these instructions at home: Eating and drinking   Eat a diet that is high in fiber and potassium, and low in sodium, added sugar, and fat. An example eating plan is called the DASH (Dietary Approaches to Stop Hypertension) diet. To eat this way: ? Eat plenty of fresh fruits and vegetables. Try to fill one half of your plate at each meal with fruits and vegetables. ? Eat whole grains, such as whole-wheat pasta, brown rice, or whole-grain bread. Fill about one fourth of your plate with whole grains. ? Eat or drink low-fat dairy products, such as skim milk or low-fat yogurt. ? Avoid fatty cuts of meat, processed or cured meats, and poultry with skin. Fill about one fourth of your plate with lean proteins, such as fish, chicken without skin, beans, eggs, or tofu. ? Avoid pre-made and processed foods. These tend to be higher in sodium, added sugar, and fat.  Reduce your daily sodium intake. Most people with hypertension should eat less than 1,500 mg of sodium a day.  Do not drink alcohol if: ? Your health care provider tells you not to  drink. ? You are pregnant, may be pregnant, or are planning to become pregnant.  If you drink alcohol: ? Limit how much you use to:  0-1 drink a day for women.  0-2 drinks a day for men. ? Be aware of how much alcohol is in your drink. In the [place] one drink equals one 12 oz bottle of beer (355 mL), one 5 oz glass of wine (148 mL), or one 1 oz glass of hard liquor (44 mL). Lifestyle   Work with your health care provider to maintain a healthy body weight or to lose weight. Ask what an ideal weight is for you.  Get at least 30 minutes of exercise most days of the week. Activities may include walking, swimming, or biking.  Include exercise to strengthen your muscles (resistance exercise), such as Pilates or lifting weights, as part of your weekly exercise routine. Try to do these types of exercises for 30 minutes at least 3 days a week.  Do not use any products that contain nicotine or tobacco, such as cigarettes, e-cigarettes, and chewing tobacco. If you need help quitting,  ask your health care provider.  Monitor your blood pressure at home as told by your health care provider.  Keep all follow-up visits as told by your health care provider. This is important. Medicines  Take over-the-counter and prescription medicines only as told by your health care provider. Follow directions carefully. Blood pressure medicines must be taken as prescribed.  Do not skip doses of blood pressure medicine. Doing this puts you at risk for problems and can make the medicine less effective.  Ask your health care provider about side effects or reactions to medicines that you should watch for. Contact a health care provider if you:  Think you are having a reaction to a medicine you are taking.  Have headaches that keep coming back (recurring).  Feel dizzy.  Have swelling in your ankles.  Have trouble with your vision. Get help right away if you:  Develop a severe headache or confusion.  Have  unusual weakness or numbness.  Feel faint.  Have severe pain in your chest or abdomen.  Vomit repeatedly.  Have trouble breathing. Summary  Hypertension is when the force of blood pumping through your arteries is too strong. If this condition is not controlled, it may put you at risk for serious complications.  Your personal target blood pressure may vary depending on your medical conditions, your age, and other factors. For most people, a normal blood pressure is less than 120/80.  Hypertension is treated with lifestyle changes, medicines, or a combination of both. Lifestyle changes include losing weight, eating a healthy, low-sodium diet, exercising more, and limiting alcohol. This information is not intended to replace advice given to you by your health care provider. Make sure you discuss any questions you have with your health care provider. Document Released: 07/28/2005 Document Revised: 04/07/2018 Document Reviewed: 04/07/2018 Elsevier Patient Education  2020 [place]  After being diagnosed with an anxiety disorder, you may be relieved to know why you have felt or behaved a certain way. It is natural to also feel overwhelmed about the treatment ahead and what it will mean for your life. With care and support, you can manage this condition and recover from it. How to cope with anxiety Dealing with stress Stress is your body's reaction to life changes and events, both good and bad. Stress can last just a few hours or it can be ongoing. Stress can play a major role in anxiety, so it is important to learn both how to cope with stress and how to think about it differently. Talk with your health care provider or a counselor to learn more about stress reduction. He or she may suggest some stress reduction techniques, such as:  Music therapy. This can include creating or listening to music that you enjoy and that inspires you.  Mindfulness-based meditation.  This involves being aware of your normal breaths, rather than trying to control your breathing. It can be done while sitting or walking.  Centering prayer. This is a kind of meditation that involves focusing on a word, phrase, or sacred image that is meaningful to you and that brings you peace.  Deep breathing. To do this, expand your stomach and inhale slowly through your nose. Hold your breath for 3-5 seconds. Then exhale slowly, allowing your stomach muscles to relax.  Self-talk. This is a skill where you identify thought patterns that lead to anxiety reactions and correct those thoughts.  Muscle relaxation. This involves tensing muscles then relaxing them. Choose a  stress reduction technique that fits your lifestyle and personality. Stress reduction techniques take time and practice. Set aside 5-15 minutes a day to do them. Therapists can offer training in these techniques. The training may be covered by some insurance plans. Other things you can do to manage stress include:  Keeping a stress diary. This can help you learn what triggers your stress and ways to control your response.  Thinking about how you respond to certain situations. You may not be able to control everything, but you can control your reaction.  Making time for activities that help you relax, and not feeling guilty about spending your time in this way. Therapy combined with coping and stress-reduction skills provides the best chance for successful treatment. Medicines Medicines can help ease symptoms. Medicines for anxiety include:  Anti-anxiety drugs.  Antidepressants.  Beta-blockers. Medicines may be used as the main treatment for anxiety disorder, along with therapy, or if other treatments are not working. Medicines should be prescribed by a health care provider. Relationships Relationships can play a big part in helping you recover. Try to spend more time connecting with trusted friends and family members.  Consider going to couples counseling, taking family education classes, or going to family therapy. Therapy can help you and others better understand the condition. How to recognize changes in your condition Everyone has a different response to treatment for anxiety. Recovery from anxiety happens when symptoms decrease and stop interfering with your daily activities at home or work. This may mean that you will start to:  Have better concentration and focus.  Sleep better.  Be less irritable.  Have more energy.  Have improved memory. It is important to recognize when your condition is getting worse. Contact your health care provider if your symptoms interfere with home or work and you do not feel like your condition is improving. Where to find help and support: You can get help and support from these sources:  Self-help groups.  Online and [employer].  A trusted spiritual leader.  Couples counseling.  Family education classes.  Family therapy. Follow these instructions at home:  Eat a healthy diet that includes plenty of vegetables, fruits, whole grains, low-fat dairy products, and lean protein. Do not eat a lot of foods that are high in solid fats, added sugars, or salt.  Exercise. Most adults should do the following: ? Exercise for at least 150 minutes each week. The exercise should increase your heart rate and make you sweat (moderate-intensity exercise). ? Strengthening exercises at least twice a week.  Cut down on caffeine, tobacco, alcohol, and other potentially harmful substances.  Get the right amount and quality of sleep. Most adults need 7-9 hours of sleep each night.  Make choices that simplify your life.  Take over-the-counter and prescription medicines only as told by your health care provider.  Avoid caffeine, alcohol, and certain over-the-counter cold medicines. These may make you feel worse. Ask your pharmacist which medicines to avoid.  Keep  all follow-up visits as told by your health care provider. This is important. Questions to ask your health care provider  Would I benefit from therapy?  How often should I follow up with a health care provider?  How long do I need to take medicine?  Are there any long-term side effects of my medicine?  Are there any alternatives to taking medicine? Contact a health care provider if:  You have a hard time staying focused or finishing daily tasks.  You spend many hours  a day feeling worried about everyday life.  You become exhausted by worry.  You start to have headaches, feel tense, or have nausea.  You urinate more than normal.  You have diarrhea. Get help right away if:  You have a racing heart and shortness of breath.  You have thoughts of hurting yourself or others. If you ever feel like you may hurt yourself or others, or have thoughts about taking your own life, get help right away. You can go to your nearest emergency department or call:  Your local emergency services (911 in the [place]  A suicide crisis helpline, such as the [place] at 908-572-3856. This is open 24-hours a day. Summary  Taking steps to deal with stress can help calm you.  Medicines cannot cure anxiety disorders, but they can help ease symptoms.  Family, friends, and partners can play a big part in helping you recover from an anxiety disorder. This information is not intended to replace advice given to you by your health care provider. Make sure you discuss any questions you have with your health care provider. Document Released: 07/22/2016 Document Revised: 07/10/2017 Document Reviewed: 07/22/2016 Elsevier Patient Education  2020 [place].  Insomnia Insomnia is a sleep disorder that makes it difficult to fall asleep or stay asleep. Insomnia can cause fatigue, low energy, difficulty concentrating, mood swings, and poor performance at work or school. There are  three different ways to classify insomnia:  Difficulty falling asleep.  Difficulty staying asleep.  Waking up too early in the morning. Any type of insomnia can be long-term (chronic) or short-term (acute). Both are common. Short-term insomnia usually lasts for three months or less. Chronic insomnia occurs at least three times a week for longer than three months. What are the causes? Insomnia may be caused by another condition, situation, or substance, such as:  Anxiety.  Certain medicines.  Gastroesophageal reflux disease (GERD) or other gastrointestinal conditions.  Asthma or other breathing conditions.  Restless legs syndrome, sleep apnea, or other sleep disorders.  Chronic pain.  Menopause.  Stroke.  Abuse of alcohol, tobacco, or illegal drugs.  Mental health conditions, such as depression.  Caffeine.  Neurological disorders, such as Alzheimer's disease.  An overactive thyroid (hyperthyroidism). Sometimes, the cause of insomnia may not be known. What increases the risk? Risk factors for insomnia include:  Gender. Women are affected more often than men.  Age. Insomnia is more common as you get older.  Stress.  Lack of exercise.  Irregular work schedule or working night shifts.  Traveling between different time zones.  Certain medical and mental health conditions. What are the signs or symptoms? If you have insomnia, the main symptom is having trouble falling asleep or having trouble staying asleep. This may lead to other symptoms, such as:  Feeling fatigued or having low energy.  Feeling nervous about going to sleep.  Not feeling rested in the morning.  Having trouble concentrating.  Feeling irritable, anxious, or depressed. How is this diagnosed? This condition may be diagnosed based on:  Your symptoms and medical history. Your health care provider may ask about: ? Your sleep habits. ? Any medical conditions you have. ? Your mental  health.  A physical exam. How is this treated? Treatment for insomnia depends on the cause. Treatment may focus on treating an underlying condition that is causing insomnia. Treatment may also include:  Medicines to help you sleep.  Counseling or therapy.  Lifestyle adjustments to help you sleep better. Follow  these instructions at home: Eating and drinking   Limit or avoid alcohol, caffeinated beverages, and cigarettes, especially close to bedtime. These can disrupt your sleep.  Do not eat a large meal or eat spicy foods right before bedtime. This can lead to digestive discomfort that can make it hard for you to sleep. Sleep habits   Keep a sleep diary to help you and your health care provider figure out what could be causing your insomnia. Write down: ? When you sleep. ? When you wake up during the night. ? How well you sleep. ? How rested you feel the next day. ? Any side effects of medicines you are taking. ? What you eat and drink.  Make your bedroom a dark, comfortable place where it is easy to fall asleep. ? Put up shades or blackout curtains to block light from outside. ? Use a white noise machine to block noise. ? Keep the temperature cool.  Limit screen use before bedtime. This includes: ? Watching TV. ? Using your smartphone, tablet, or computer.  Stick to a routine that includes going to bed and waking up at the same times every day and night. This can help you fall asleep faster. Consider making a quiet activity, such as reading, part of your nighttime routine.  Try to avoid taking naps during the day so that you sleep better at night.  Get out of bed if you are still awake after 15 minutes of trying to sleep. Keep the lights down, but try reading or doing a quiet activity. When you feel sleepy, go back to bed. General instructions  Take over-the-counter and prescription medicines only as told by your health care provider.  Exercise regularly, as told by  your health care provider. Avoid exercise starting several hours before bedtime.  Use relaxation techniques to manage stress. Ask your health care provider to suggest some techniques that may work well for you. These may include: ? Breathing exercises. ? Routines to release muscle tension. ? Visualizing peaceful scenes.  Make sure that you drive carefully. Avoid driving if you feel very sleepy.  Keep all follow-up visits as told by your health care provider. This is important. Contact a health care provider if:  You are tired throughout the day.  You have trouble in your daily routine due to sleepiness.  You continue to have sleep problems, or your sleep problems get worse. Get help right away if:  You have serious thoughts about hurting yourself or someone else. If you ever feel like you may hurt yourself or others, or have thoughts about taking your own life, get help right away. You can go to your nearest emergency department or call:  Your local emergency services (911 in the [place]  A suicide crisis helpline, such as the [facility] at (910)367-6391. This is open 24 hours a day. Summary  Insomnia is a sleep disorder that makes it difficult to fall asleep or stay asleep.  Insomnia can be long-term (chronic) or short-term (acute).  Treatment for insomnia depends on the cause. Treatment may focus on treating an underlying condition that is causing insomnia.  Keep a sleep diary to help you and your health care provider figure out what could be causing your insomnia. This information is not intended to replace advice given to you by your health care provider. Make sure you discuss any questions you have with your health care provider. Document Released: 07/25/2000 Document Revised: 07/10/2017 Document Reviewed: 05/07/2017 Elsevier Patient Education  [place] is a normal reaction to life events. Stress is what you feel when  life demands more than you are used to, or more than you think you can handle. Some stress can be useful, such as studying for a test or meeting a deadline at work. Stress that occurs too often or for too long can cause problems. It can affect your emotional health and interfere with relationships and normal daily activities. Too much stress can weaken your body's defense system (immune system) and increase your risk for physical illness. If you already have a medical problem, stress can make it worse. What are the causes? All sorts of life events can cause stress. An event that causes stress for one person may not be stressful for another person. Major life events, whether positive or negative, commonly cause stress. Examples include:  Losing a job or starting a new job.  Losing a loved one.  Moving to a new town or home.  Getting married or divorced.  Having a baby.  Injury or illness. Less obvious life events can also cause stress, especially if they occur day after day or in combination with each other. Examples include:  Working long hours.  Driving in traffic.  Caring for children.  Being in debt.  Being in a difficult relationship. What are the signs or symptoms? Stress can cause emotional symptoms, including:  Anxiety. This is feeling worried, afraid, on edge, overwhelmed, or out of control.  Anger, including irritation or impatience.  Depression. This is feeling sad, down, helpless, or guilty.  Trouble focusing, remembering, or making decisions. Stress can cause physical symptoms, including:  Aches and pains. These may affect your head, neck, back, stomach, or other areas of your body.  Tight muscles or a clenched jaw.  Low energy.  Trouble sleeping. Stress can cause unhealthy behaviors, including:  Eating to feel better (overeating) or skipping meals.  Working too much or putting off tasks.  Smoking, drinking alcohol, or using drugs to feel better. How is  this diagnosed? Stress is diagnosed through an assessment by your health care provider. He or she may diagnose this condition based on:  Your symptoms and any stressful life events.  Your medical history.  Tests to rule out other causes of your symptoms. Depending on your condition, your health care provider may refer you to a specialist for further evaluation. How is this treated?  Stress management techniques are the recommended treatment for stress. Medicine is not typically recommended for the treatment of stress. Techniques to reduce your reaction to stressful life events include:  Stress identification. Monitor yourself for symptoms of stress and identify what causes stress for you. These skills may help you to avoid or prepare for stressful events.  Time management. Set your priorities, keep a calendar of events, and learn to say "no." Taking these actions can help you avoid making too many commitments. Techniques for coping with stress include:  Rethinking the problem. Try to think realistically about stressful events rather than ignoring them or overreacting. Try to find the positives in a stressful situation rather than focusing on the negatives.  Exercise. Physical exercise can release both physical and emotional tension. The key is to find a form of exercise that you enjoy and do it regularly.  Relaxation techniques. These relax the body and mind. The key is to find one or more that you enjoy and use the technique(s) regularly. Examples include: ? Meditation, deep breathing, or progressive relaxation techniques. ?  Yoga or tai chi. ? Biofeedback, mindfulness techniques, or journaling. ? Listening to music, being out in nature, or participating in other hobbies.  Practicing a healthy lifestyle. Eat a balanced diet, drink plenty of water, limit or avoid caffeine, and get plenty of sleep.  Having a strong support network. Spend time with family, friends, or other people you  enjoy being around. Express your feelings and talk things over with someone you trust. Counseling or talk therapy with a mental health professional may be helpful if you are having trouble managing stress on your own. Follow these instructions at home: Lifestyle   Avoid drugs.  Do not use any products that contain nicotine or tobacco, such as cigarettes and e-cigarettes. If you need help quitting, ask your health care provider.  Limit alcohol intake to no more than 1 drink a day for nonpregnant women and 2 drinks a day for men. One drink equals 12 oz of beer, 5 oz of wine, or 1 oz of hard liquor.  Do not use alcohol or drugs to relax.  Eat a balanced diet that includes fresh fruits and vegetables, whole grains, lean meats, fish, eggs, and beans, and low-fat dairy. Avoid processed foods and foods high in added fat, sugar, and salt.  Exercise at least 30 minutes on 5 or more days each week.  Get 7-8 hours of sleep each night. General instructions   Practice stress management techniques as discussed with your health care provider.  Drink enough fluid to keep your urine clear or pale yellow.  Take over-the-counter and prescription medicines only as told by your health care provider.  Keep all follow-up visits as told by your health care provider. This is important. Contact a health care provider if:  Your symptoms get worse.  You have new symptoms.  You feel overwhelmed by your problems and can no longer manage them on your own. Get help right away if:  You have thoughts of hurting yourself or others. If you ever feel like you may hurt yourself or others, or have thoughts about taking your own life, get help right away. You can go to your nearest emergency department or call:  Your local emergency services (911 in the [place]  A suicide crisis helpline, such as the [facility] at 267-515-5626. This is open 24 hours a day. Summary  Stress is a  normal reaction to life events. It can cause problems if it happens too often or for too long.  Practicing stress management techniques is the best way to treat stress.  Counseling or talk therapy with a mental health professional may be helpful if you are having trouble managing stress on your own. This information is not intended to replace advice given to you by your health care provider. Make sure you discuss any questions you have with your health care provider. Document Released: 01/21/2001 Document Revised: 07/10/2017 Document Reviewed: 09/17/2016 Elsevier Patient Education  2020 [employer].    How to Take Your Blood Pressure You can take your blood pressure at home with a machine. You may need to check your blood pressure at home:  To check if you have high blood pressure (hypertension).  To check your blood pressure over time.  To make sure your blood pressure medicine is working. Supplies needed: You will need a blood pressure machine, or monitor. You can buy one at a drugstore or online. When choosing one:  Choose one with an arm cuff.  Choose one that wraps around your  upper arm. Only one finger should fit between your arm and the cuff.  Do not choose one that measures your blood pressure from your wrist or finger. Your doctor can suggest a monitor. How to prepare Avoid these things for 30 minutes before checking your blood pressure:  Drinking caffeine.  Drinking alcohol.  Eating.  Smoking.  Exercising. Five minutes before checking your blood pressure:  Pee.  Sit in a dining chair. Avoid sitting in a soft couch or armchair.  Be quiet. Do not talk. How to take your blood pressure Follow the instructions that came with your machine. If you have a digital blood pressure monitor, these may be the instructions: 1. Sit up straight. 2. Place your feet on the floor. Do not cross your ankles or legs. 3. Rest your left arm at the level of your heart. You may  rest it on a table, desk, or chair. 4. Pull up your shirt sleeve. 5. Wrap the blood pressure cuff around the upper part of your left arm. The cuff should be 1 inch (2.5 cm) above your elbow. It is best to wrap the cuff around bare skin. 6. Fit the cuff snugly around your arm. You should be able to place only one finger between the cuff and your arm. 7. Put the cord inside the groove of your elbow. 8. Press the power button. 9. Sit quietly while the cuff fills with air and loses air. 10. Write down the numbers on the screen. 11. Wait 2-3 minutes and then repeat steps 1-10. What do the numbers mean? Two numbers make up your blood pressure. The first number is called systolic pressure. The second is called diastolic pressure. An example of a blood pressure reading is "120 over 80" (or 120/80). If you are an adult and do not have a medical condition, use this guide to find out if your blood pressure is normal: Normal  First number: below 120.  Second number: below 80. Elevated  First number: 120-129.  Second number: below 80. Hypertension stage 1  First number: 130-139.  Second number: 80-89. Hypertension stage 2  First number: 140 or above.  Second number: 104 or above. Your blood pressure is above normal even if only the top or bottom number is above normal. Follow these instructions at home:  Check your blood pressure as often as your doctor tells you to.  Take your monitor to your next doctor's appointment. Your doctor will: ? Make sure you are using it correctly. ? Make sure it is working right.  Make sure you understand what your blood pressure numbers should be.  Tell your doctor if your medicines are causing side effects. Contact a doctor if:  Your blood pressure keeps being high. Get help right away if:  Your first blood pressure number is higher than 180.  Your second blood pressure number is higher than 120. This information is not intended to replace advice  given to you by your health care provider. Make sure you discuss any questions you have with your health care provider. Document Released: 07/10/2008 Document Revised: 07/10/2017 Document Reviewed: 01/04/2016 Elsevier Patient Education  2020 [employer].    Acute Back Pain, Adult Acute back pain is sudden and usually short-lived. It is often caused by an injury to the muscles and tissues in the back. The injury may result from:  A muscle or ligament getting overstretched or torn (strained). Ligaments are tissues that connect bones to each other. Lifting something improperly can cause a  back strain.  Wear and tear (degeneration) of the spinal disks. Spinal disks are circular tissue that provides cushioning between the bones of the spine (vertebrae).  Twisting motions, such as while playing sports or doing yard work.  A hit to the back.  Arthritis. You may have a physical exam, lab tests, and imaging tests to find the cause of your pain. Acute back pain usually goes away with rest and home care. Follow these instructions at home: Managing pain, stiffness, and swelling  Take over-the-counter and prescription medicines only as told by your health care provider.  Your health care provider may recommend applying ice during the first 24-48 hours after your pain starts. To do this: ? Put ice in a plastic bag. ? Place a towel between your skin and the bag. ? Leave the ice on for 20 minutes, 2-3 times a day.  If directed, apply heat to the affected area as often as told by your health care provider. Use the heat source that your health care provider recommends, such as a moist heat pack or a heating pad. ? Place a towel between your skin and the heat source. ? Leave the heat on for 20-30 minutes. ? Remove the heat if your skin turns bright red. This is especially important if you are unable to feel pain, heat, or cold. You have a greater risk of getting burned. Activity   Do not stay in  bed. Staying in bed for more than 1-2 days can delay your recovery.  Sit up and stand up straight. Avoid leaning forward when you sit, or hunching over when you stand. ? If you work at a desk, sit close to it so you do not need to lean over. Keep your chin tucked in. Keep your neck drawn back, and keep your elbows bent at a right angle. Your arms should look like the letter "L." ? Sit high and close to the steering wheel when you drive. Add lower back (lumbar) support to your car seat, if needed.  Take short walks on even surfaces as soon as you are able. Try to increase the length of time you walk each day.  Do not sit, drive, or stand in one place for more than 30 minutes at a time. Sitting or standing for long periods of time can put stress on your back.  Do not drive or use heavy machinery while taking prescription pain medicine.  Use proper lifting techniques. When you bend and lift, use positions that put less stress on your back: ? [employer] your knees. ? Keep the load close to your body. ? Avoid twisting.  Exercise regularly as told by your health care provider. Exercising helps your back heal faster and helps prevent back injuries by keeping muscles strong and flexible.  Work with a physical therapist to make a safe exercise program, as recommended by your health care provider. Do any exercises as told by your physical therapist. Lifestyle  Maintain a healthy weight. Extra weight puts stress on your back and makes it difficult to have good posture.  Avoid activities or situations that make you feel anxious or stressed. Stress and anxiety increase muscle tension and can make back pain worse. Learn ways to manage anxiety and stress, such as through exercise. General instructions  Sleep on a firm mattress in a comfortable position. Try lying on your side with your knees slightly bent. If you lie on your back, put a pillow under your knees.  Follow your treatment plan  as told by your  health care provider. This may include: ? Cognitive or behavioral therapy. ? Acupuncture or massage therapy. ? Meditation or yoga. Contact a health care provider if:  You have pain that is not relieved with rest or medicine.  You have increasing pain going down into your legs or buttocks.  Your pain does not improve after 2 weeks.  You have pain at night.  You lose weight without trying.  You have a fever or chills. Get help right away if:  You develop new bowel or bladder control problems.  You have unusual weakness or numbness in your arms or legs.  You develop nausea or vomiting.  You develop abdominal pain.  You feel faint. Summary  Acute back pain is sudden and usually short-lived.  Use proper lifting techniques. When you bend and lift, use positions that put less stress on your back.  Take over-the-counter and prescription medicines and apply heat or ice as directed by your health care provider. This information is not intended to replace advice given to you by your health care provider. Make sure you discuss any questions you have with your health care provider. Document Released: 07/28/2005 Document Revised: 11/16/2018 Document Reviewed: 03/11/2017 Elsevier Patient Education  [employer].   If you have lab work done today you will be contacted with your lab results within the next 2 weeks.  If you have not heard from [place] then please contact us. The fastest way to get your results is to register for My Chart.   IF you received an x-ray today, you will receive an invoice from [facility]. Please contact [facility] at [phone] with questions or concerns regarding your invoice.   IF you received labwork today, you will receive an invoice from [facility]. Please contact [facility] at [phone] with questions or concerns regarding your invoice.   Our billing staff will not be able to assist you with questions regarding bills from these  companies.  You will be contacted with the lab results as soon as they are available. The fastest way to get your results is to activate your My Chart account. Instructions are located on the last page of this paperwork. If you have not heard from [place] regarding the results in 2 weeks, please contact this office.

## 2019-05-17 LAB — HEMOGLOBIN A1C
Est. average glucose Bld gHb Est-mCnc: 114 mg/dL
Hgb A1c MFr Bld: 5.6 % (ref 4.8–5.6)

## 2019-05-17 LAB — TSH: TSH: 2.18 u[IU]/mL (ref 0.450–4.500)

## 2019-05-17 LAB — LIPID PANEL
Chol/HDL Ratio: 3.1 ratio (ref 0.0–4.4)
Cholesterol, Total: 247 mg/dL — ABNORMAL HIGH (ref 100–199)
HDL: 79 mg/dL (ref >39)
LDL Chol Calc (NIH): 153 mg/dL — ABNORMAL HIGH (ref 0–99)
Triglycerides: 89 mg/dL (ref 0–149)
VLDL Cholesterol Cal: 15 mg/dL (ref 5–40)

## 2019-05-17 LAB — COMPREHENSIVE METABOLIC PANEL
ALT: 13 IU/L (ref 0–32)
AST: 20 IU/L (ref 0–40)
Albumin/Globulin Ratio: 2 (ref 1.2–2.2)
Albumin: 4.5 g/dL (ref 3.8–4.9)
Alkaline Phosphatase: 106 IU/L (ref 39–117)
BUN/Creatinine Ratio: 17 (ref 12–28)
BUN: 12 mg/dL (ref 8–27)
Bilirubin Total: 0.4 mg/dL (ref 0.0–1.2)
CO2: 23 mmol/L (ref 20–29)
Calcium: 8.9 mg/dL (ref 8.7–10.3)
Chloride: 104 mmol/L (ref 96–106)
Creatinine, Ser: 0.72 mg/dL (ref 0.57–1.00)
GFR calc Af Amer: 105 mL/min/{1.73_m2} (ref >59)
GFR calc non Af Amer: 91 mL/min/{1.73_m2} (ref >59)
Globulin, Total: 2.2 g/dL (ref 1.5–4.5)
Glucose: 88 mg/dL (ref 65–99)
Potassium: 4.2 mmol/L (ref 3.5–5.2)
Sodium: 141 mmol/L (ref 134–144)
Total Protein: 6.7 g/dL (ref 6.0–8.5)

## 2019-05-24 ENCOUNTER — Encounter: Payer: Self-pay | Admitting: Radiology

## 2019-06-06 ENCOUNTER — Ambulatory Visit (INDEPENDENT_AMBULATORY_CARE_PROVIDER_SITE_OTHER): Payer: 59 | Admitting: Family Medicine

## 2019-06-06 ENCOUNTER — Other Ambulatory Visit: Payer: Self-pay

## 2019-06-06 ENCOUNTER — Encounter: Payer: Self-pay | Admitting: Family Medicine

## 2019-06-06 VITALS — BP 138/83 | HR 75 | Temp 98.7°F | Wt 145.0 lb

## 2019-06-06 DIAGNOSIS — I10 Essential (primary) hypertension: Secondary | ICD-10-CM

## 2019-06-06 DIAGNOSIS — H539 Unspecified visual disturbance: Secondary | ICD-10-CM

## 2019-06-06 DIAGNOSIS — F5104 Psychophysiologic insomnia: Secondary | ICD-10-CM

## 2019-06-06 DIAGNOSIS — F411 Generalized anxiety disorder: Secondary | ICD-10-CM | POA: Diagnosis not present

## 2019-06-06 DIAGNOSIS — M549 Dorsalgia, unspecified: Secondary | ICD-10-CM | POA: Diagnosis not present

## 2019-06-06 MED ORDER — HYDROXYZINE HCL 25 MG PO TABS: 12.5000 mg | ORAL_TABLET | Freq: Every evening | ORAL | 1 refills | Status: DC | PRN

## 2019-06-06 MED ORDER — FLUOXETINE HCL 20 MG PO TABS: 20.0000 mg | ORAL_TABLET | Freq: Every day | ORAL | 1 refills | Status: DC

## 2019-06-06 NOTE — Patient Instructions (Addendum)
Start prozac for anxiety. 1 per day. Please call counselor as that is beneficial for anxiety treatment. Recheck in 6 weeks. Ok to continue hydroxyzine for sleep.  Return to the clinic or go to the nearest emergency room if any of your symptoms worsen or new symptoms occur.  No change in blood pressure med.  Follow up if back pain not continuing to improve.   Please let your eye specialists know to send me notes.   Thanks for coming in today and take care.    Living With Anxiety  After being diagnosed with an anxiety disorder, you may be relieved to know why you have felt or behaved a certain way. It is natural to also feel overwhelmed about the treatment ahead and what it will mean for your life. With care and support, you can manage this condition and recover from it. How to cope with anxiety Dealing with stress Stress is your body's reaction to life changes and events, both good and bad. Stress can last just a few hours or it can be ongoing. Stress can play a major role in anxiety, so it is important to learn both how to cope with stress and how to think about it differently. Talk with your health care provider or a counselor to learn more about stress reduction. He or she may suggest some stress reduction techniques, such as:  Music therapy. This can include creating or listening to music that you enjoy and that inspires you.  Mindfulness-based meditation. This involves being aware of your normal breaths, rather than trying to control your breathing. It can be done while sitting or walking.  Centering prayer. This is a kind of meditation that involves focusing on a word, phrase, or sacred image that is meaningful to you and that brings you peace.  Deep breathing. To do this, expand your stomach and inhale slowly through your nose. Hold your breath for 3-5 seconds. Then exhale slowly, allowing your stomach muscles to relax.  Self-talk. This is a skill where you identify thought  patterns that lead to anxiety reactions and correct those thoughts.  Muscle relaxation. This involves tensing muscles then relaxing them. Choose a stress reduction technique that fits your lifestyle and personality. Stress reduction techniques take time and practice. Set aside 5-15 minutes a day to do them. Therapists can offer training in these techniques. The training may be covered by some insurance plans. Other things you can do to manage stress include:  Keeping a stress diary. This can help you learn what triggers your stress and ways to control your response.  Thinking about how you respond to certain situations. You may not be able to control everything, but you can control your reaction.  Making time for activities that help you relax, and not feeling guilty about spending your time in this way. Therapy combined with coping and stress-reduction skills provides the best chance for successful treatment. Medicines Medicines can help ease symptoms. Medicines for anxiety include:  Anti-anxiety drugs.  Antidepressants.  Beta-blockers. Medicines may be used as the main treatment for anxiety disorder, along with therapy, or if other treatments are not working. Medicines should be prescribed by a health care provider. Relationships Relationships can play a big part in helping you recover. Try to spend more time connecting with trusted friends and family members. Consider going to couples counseling, taking family education classes, or going to family therapy. Therapy can help you and others better understand the condition. How to recognize changes in your condition  Everyone has a different response to treatment for anxiety. Recovery from anxiety happens when symptoms decrease and stop interfering with your daily activities at home or work. This may mean that you will start to:  Have better concentration and focus.  Sleep better.  Be less irritable.  Have more energy.  Have improved  memory. It is important to recognize when your condition is getting worse. Contact your health care provider if your symptoms interfere with home or work and you do not feel like your condition is improving. Where to find help and support: You can get help and support from these sources:  Self-help groups.  Online and [employer].  A trusted spiritual leader.  Couples counseling.  Family education classes.  Family therapy. Follow these instructions at home:  Eat a healthy diet that includes plenty of vegetables, fruits, whole grains, low-fat dairy products, and lean protein. Do not eat a lot of foods that are high in solid fats, added sugars, or salt.  Exercise. Most adults should do the following: ? Exercise for at least 150 minutes each week. The exercise should increase your heart rate and make you sweat (moderate-intensity exercise). ? Strengthening exercises at least twice a week.  Cut down on caffeine, tobacco, alcohol, and other potentially harmful substances.  Get the right amount and quality of sleep. Most adults need 7-9 hours of sleep each night.  Make choices that simplify your life.  Take over-the-counter and prescription medicines only as told by your health care provider.  Avoid caffeine, alcohol, and certain over-the-counter cold medicines. These may make you feel worse. Ask your pharmacist which medicines to avoid.  Keep all follow-up visits as told by your health care provider. This is important. Questions to ask your health care provider  Would I benefit from therapy?  How often should I follow up with a health care provider?  How long do I need to take medicine?  Are there any long-term side effects of my medicine?  Are there any alternatives to taking medicine? Contact a health care provider if:  You have a hard time staying focused or finishing daily tasks.  You spend many hours a day feeling worried about everyday life.  You  become exhausted by worry.  You start to have headaches, feel tense, or have nausea.  You urinate more than normal.  You have diarrhea. Get help right away if:  You have a racing heart and shortness of breath.  You have thoughts of hurting yourself or others. If you ever feel like you may hurt yourself or others, or have thoughts about taking your own life, get help right away. You can go to your nearest emergency department or call:  Your local emergency services (911 in the [place]  A suicide crisis helpline, such as the National Suicide Prevention Lifeline at (709)518-0892. This is open 24-hours a day. Summary  Taking steps to deal with stress can help calm you.  Medicines cannot cure anxiety disorders, but they can help ease symptoms.  Family, friends, and partners can play a big part in helping you recover from an anxiety disorder. This information is not intended to replace advice given to you by your health care provider. Make sure you discuss any questions you have with your health care provider. Document Released: 07/22/2016 Document Revised: 07/10/2017 Document Reviewed: 07/22/2016 Elsevier Patient Education  [employer].   If you have lab work done today you will be contacted with your lab results within the next 2  weeks.  If you have not heard from us then please contact us. The fastest way to get your results is to register for My Chart.   IF you received an x-ray today, you will receive an invoice from [facility]. Please contact [facility] at [phone] with questions or concerns regarding your invoice.   IF you received labwork today, you will receive an invoice from [facility]. Please contact [facility] at [phone] with questions or concerns regarding your invoice.   Our billing staff will not be able to assist you with questions regarding bills from these companies.  You will be contacted with the lab results as soon as they are  available. The fastest way to get your results is to activate your My Chart account. Instructions are located on the last page of this paperwork. If you have not heard from us regarding the results in 2 weeks, please contact this office.

## 2019-06-06 NOTE — Progress Notes (Signed)
Subjective:    Patient ID: Jenny Davidson, female    DOB: [birth year], 58 y.o.   MRN: [id number]  HPI Jenny Davidson is a 58 y.o. female Presents today for: Chief Complaint  Patient presents with  . Follow-up     3 week f/u on bp,stree and back pain. Stress and back is a little better. GAD7=14   Hypertension: BP Readings from Last 3 Encounters:  06/06/19 140/80  05/16/19 140/90  03/25/16 168/95   Lab Results  Component Value Date   CREATININE 0.72 05/16/2019  amlodipine  QD started at 05/16/19 visit.  Home readings lower. 119/69.  No new side effects, or LE edema.   Stress/anxiety Discussed October 5 visit.  Suspected component of generalized anxiety that has been managed with worsening symptoms since pandemic.  SSRI was discussed but deferred initially.  Counseling discussed, coping techniques and stress management discussed with handout given, hydroxyzine as needed for sleep. Vacation last week helped. Hydroxyzine used at times, not nightly -helpful.  Still some anxiety attack- few times last week on vacation Coping technique: sitting in quiet place, counting.  Did not call counselor.    GAD 7 : Generalized Anxiety Score 06/06/2019 05/16/2019  Nervous, Anxious, on Edge 1 2  Control/stop worrying 3 3  Worry too much - different things 3 3  Trouble relaxing 2 3  Restless 1 3  Easily annoyed or irritable 1 2  Afraid - awful might happen 3 3  Total GAD 7 Score 14 19  Anxiety Difficulty Somewhat difficult Somewhat difficult    Depression screen [facility] 2/9 06/06/2019 05/16/2019 01/16/2016 07/15/2015  Decreased Interest 0 0 0 0  Down, Depressed, Hopeless 0 0 0 0  PHQ - 2 Score 0 0 0 0   Mid back pain Also evaluated last visit, thought to be paraspinal/muscular cause.  No chest symptoms, no focal bony tenderness, symptomatic care discussed with heat, range of motion, stretches. Doing better with less work and massage, yoga poses.  Vision change: Noticed change in vision past  1-2 months. Glare at night, some blurring during the day. Thought scratch in glasses. Dr. Caryn Davidson - optometry. Scar issue on left eye, but able to achieve 20/25. R eye concern as only able to get to 20/50. Saw retina specialist. No retinal problem. Redness and inflammation. Optic nerve inflammation - other provider this week and lab work pending.    Patient Active Problem List   Diagnosis Date Noted  . Seasonal allergies 12/09/2013  . Osteopenia 12/09/2013   Past Medical History:  Diagnosis Date  . Allergy   . Arthritis   . Osteoporosis    Past Surgical History:  Procedure Laterality Date  . ABDOMINAL HYSTERECTOMY    . BREAST SURGERY    . TUBAL LIGATION    . tumore removed  2008   Allergies  Allergen Reactions  . Erythromycin Nausea And Vomiting  . Iodine Rash   Prior to Admission medications   Medication Sig Start Date End Date Taking? Authorizing Provider  amLODipine (NORVASC) 5 MG tablet Take 1 tablet (5 mg total) by mouth daily. 05/16/19  Yes Shade Flood, MD  fexofenadine (ALLEGRA) 60 MG tablet Take 60 mg by mouth 2 (two) times daily.   Yes [provider]  hydrOXYzine (ATARAX/VISTARIL) 25 MG tablet Take 0.5-1 tablets (12.5-25 mg total) by mouth at bedtime as needed. 05/16/19  Yes Shade Flood, MD   Social History   Socioeconomic History  . Marital status: Married    Spouse name:  Not on file  . Number of children: Not on file  . Years of education: Not on file  . Highest education level: Not on file  Occupational History  . Not on file  Social Needs  . Financial resource strain: Not on file  . Food insecurity    Worry: Not on file    Inability: Not on file  . Transportation needs    Medical: Not on file    Non-medical: Not on file  Tobacco Use  . Smoking status: Former Games developermoker  . Smokeless tobacco: Never Used  Substance and Sexual Activity  . Alcohol use: Yes  . Drug use: Not on file  . Sexual activity: Not on file  Lifestyle  . Physical  activity    Days per week: Not on file    Minutes per session: Not on file  . Stress: Not on file  Relationships  . Social Musicianconnections    Talks on phone: Not on file    Gets together: Not on file    Attends religious service: Not on file    Active member of club or organization: Not on file    Attends meetings of clubs or organizations: Not on file    Relationship status: Not on file  . Intimate partner violence    Fear of current or ex partner: Not on file    Emotionally abused: Not on file    Physically abused: Not on file    Forced sexual activity: Not on file  Other Topics Concern  . Not on file  Social History Narrative  . Not on file    Review of Systems  Constitutional: Negative for fatigue and unexpected weight change.  Respiratory: Negative for chest tightness and shortness of breath.   Cardiovascular: Negative for chest pain, palpitations and leg swelling.  Gastrointestinal: Negative for abdominal pain and blood in stool.  Neurological: Negative for dizziness, syncope, light-headedness and headaches.       Objective:   Physical Exam Vitals signs reviewed.  Constitutional:      Appearance: She is well-developed.  HENT:     Head: Normocephalic and atraumatic.  Eyes:     Extraocular Movements: Extraocular movements intact.     Conjunctiva/sclera: Conjunctivae normal.     Pupils: Pupils are equal, round, and reactive to light.  Neck:     Vascular: No carotid bruit.  Cardiovascular:     Rate and Rhythm: Normal rate and regular rhythm.     Heart sounds: Normal heart sounds.  Pulmonary:     Effort: Pulmonary effort is normal.     Breath sounds: Normal breath sounds.  Abdominal:     Palpations: Abdomen is soft. There is no pulsatile mass.     Tenderness: There is no abdominal tenderness.  Musculoskeletal:       Back:  Skin:    General: Skin is warm and dry.  Neurological:     Mental Status: She is alert and oriented to person, place, and time.   Psychiatric:        Behavior: Behavior normal.    Vitals:   06/06/19 0845  BP: 140/80  Pulse: 75  Temp: 98.7 F (37.1 C)  TempSrc: Oral  SpO2: 97%  Weight: 145 lb (65.8 kg)  Body mass index is 25.28 kg/m.      Assessment & Plan:    Jenny ReedsGail Davidson is a 58 y.o. female Generalized anxiety disorder - Plan: FLUoxetine (PROZAC) 20 MG tablet Psychophysiological insomnia - Plan: hydrOXYzine (ATARAX/VISTARIL) 25  MG tablet  -Suspected generalized anxiety disorder versus panic component versus anxiety attacks, even on vacation.  -Stressed importance of Cytogeneticist.  Continue coping/stress management techniques, but with persistent symptoms will start fluoxetine 20 mg daily.  Potential side effects of meds discussed.  Recheck in 6 weeks sooner if worse  -Continue hydroxyzine nightly as needed.   Essential hypertension  -Improved with improved home readings, continue same dose amlodipine  Mid back pain  -Improving, still mild discomfort paraspinal muscles.  Range of motion, stretches, option of PT discussed.  RTC precautions if worse for imaging/further eval  Vision changes  -Under care of optometry and retina specialist.  Apparently blood work has been obtained to look for underlying causes.  Advised patient to have specialist send me notes, especially if any work-up needed from my standpoint.  Meds ordered this encounter  Medications  . FLUoxetine (PROZAC) 20 MG tablet    Sig: Take 1 tablet (20 mg total) by mouth daily.    Dispense:  90 tablet    Refill:  1  . hydrOXYzine (ATARAX/VISTARIL) 25 MG tablet    Sig: Take 0.5-1 tablets (12.5-25 mg total) by mouth at bedtime as needed.    Dispense:  30 tablet    Refill:  1   Patient Instructions    Start prozac for anxiety. 1 per day. Please call counselor as that is beneficial for anxiety treatment. Recheck in 6 weeks. Ok to continue hydroxyzine for sleep.  Return to the clinic or go to the nearest emergency room if any of your  symptoms worsen or new symptoms occur.  No change in blood pressure med.  Follow up if back pain not continuing to improve.   Please let your eye specialists know to send me notes.   Thanks for coming in today and take care.    Living With Anxiety  After being diagnosed with an anxiety disorder, you may be relieved to know why you have felt or behaved a certain way. It is natural to also feel overwhelmed about the treatment ahead and what it will mean for your life. With care and support, you can manage this condition and recover from it. How to cope with anxiety Dealing with stress Stress is your body's reaction to life changes and events, both good and bad. Stress can last just a few hours or it can be ongoing. Stress can play a major role in anxiety, so it is important to learn both how to cope with stress and how to think about it differently. Talk with your health care provider or a counselor to learn more about stress reduction. He or she may suggest some stress reduction techniques, such as:  Music therapy. This can include creating or listening to music that you enjoy and that inspires you.  Mindfulness-based meditation. This involves being aware of your normal breaths, rather than trying to control your breathing. It can be done while sitting or walking.  Centering prayer. This is a kind of meditation that involves focusing on a word, phrase, or sacred image that is meaningful to you and that brings you peace.  Deep breathing. To do this, expand your stomach and inhale slowly through your nose. Hold your breath for 3-5 seconds. Then exhale slowly, allowing your stomach muscles to relax.  Self-talk. This is a skill where you identify thought patterns that lead to anxiety reactions and correct those thoughts.  Muscle relaxation. This involves tensing muscles then relaxing them. Choose a stress reduction technique that fits  your lifestyle and personality. Stress reduction  techniques take time and practice. Set aside 5-15 minutes a day to do them. Therapists can offer training in these techniques. The training may be covered by some insurance plans. Other things you can do to manage stress include:  Keeping a stress diary. This can help you learn what triggers your stress and ways to control your response.  Thinking about how you respond to certain situations. You may not be able to control everything, but you can control your reaction.  Making time for activities that help you relax, and not feeling guilty about spending your time in this way. Therapy combined with coping and stress-reduction skills provides the best chance for successful treatment. Medicines Medicines can help ease symptoms. Medicines for anxiety include:  Anti-anxiety drugs.  Antidepressants.  Beta-blockers. Medicines may be used as the main treatment for anxiety disorder, along with therapy, or if other treatments are not working. Medicines should be prescribed by a health care provider. Relationships Relationships can play a big part in helping you recover. Try to spend more time connecting with trusted friends and family members. Consider going to couples counseling, taking family education classes, or going to family therapy. Therapy can help you and others better understand the condition. How to recognize changes in your condition Everyone has a different response to treatment for anxiety. Recovery from anxiety happens when symptoms decrease and stop interfering with your daily activities at home or work. This may mean that you will start to:  Have better concentration and focus.  Sleep better.  Be less irritable.  Have more energy.  Have improved memory. It is important to recognize when your condition is getting worse. Contact your health care provider if your symptoms interfere with home or work and you do not feel like your condition is improving. Where to find help and  support: You can get help and support from these sources:  Self-help groups.  Online and [employer].  A trusted spiritual leader.  Couples counseling.  Family education classes.  Family therapy. Follow these instructions at home:  Eat a healthy diet that includes plenty of vegetables, fruits, whole grains, low-fat dairy products, and lean protein. Do not eat a lot of foods that are high in solid fats, added sugars, or salt.  Exercise. Most adults should do the following: ? Exercise for at least 150 minutes each week. The exercise should increase your heart rate and make you sweat (moderate-intensity exercise). ? Strengthening exercises at least twice a week.  Cut down on caffeine, tobacco, alcohol, and other potentially harmful substances.  Get the right amount and quality of sleep. Most adults need 7-9 hours of sleep each night.  Make choices that simplify your life.  Take over-the-counter and prescription medicines only as told by your health care provider.  Avoid caffeine, alcohol, and certain over-the-counter cold medicines. These may make you feel worse. Ask your pharmacist which medicines to avoid.  Keep all follow-up visits as told by your health care provider. This is important. Questions to ask your health care provider  Would I benefit from therapy?  How often should I follow up with a health care provider?  How long do I need to take medicine?  Are there any long-term side effects of my medicine?  Are there any alternatives to taking medicine? Contact a health care provider if:  You have a hard time staying focused or finishing daily tasks.  You spend many hours a day feeling worried about  everyday life.  You become exhausted by worry.  You start to have headaches, feel tense, or have nausea.  You urinate more than normal.  You have diarrhea. Get help right away if:  You have a racing heart and shortness of breath.  You have  thoughts of hurting yourself or others. If you ever feel like you may hurt yourself or others, or have thoughts about taking your own life, get help right away. You can go to your nearest emergency department or call:  Your local emergency services (911 in the [place]  A suicide crisis helpline, such as the National Suicide Prevention Lifeline at (416) 222-5426. This is open 24-hours a day. Summary  Taking steps to deal with stress can help calm you.  Medicines cannot cure anxiety disorders, but they can help ease symptoms.  Family, friends, and partners can play a big part in helping you recover from an anxiety disorder. This information is not intended to replace advice given to you by your health care provider. Make sure you discuss any questions you have with your health care provider. Document Released: 07/22/2016 Document Revised: 07/10/2017 Document Reviewed: 07/22/2016 Elsevier Patient Education  [employer].   If you have lab work done today you will be contacted with your lab results within the next 2 weeks.  If you have not heard from [place] then please contact us. The fastest way to get your results is to register for My Chart.   IF you received an x-ray today, you will receive an invoice from [facility]. Please contact [facility] at [phone] with questions or concerns regarding your invoice.   IF you received labwork today, you will receive an invoice from [place]. Please contact [facility] at [phone] with questions or concerns regarding your invoice.   Our billing staff will not be able to assist you with questions regarding bills from these companies.  You will be contacted with the lab results as soon as they are available. The fastest way to get your results is to activate your My Chart account. Instructions are located on the last page of this paperwork. If you have not heard from [place] regarding the results in 2 weeks, please contact this  office.      Signed,   Meredith Staggers, MD [facility].  06/06/19 9:16 AM

## 2019-07-18 ENCOUNTER — Other Ambulatory Visit: Payer: Self-pay

## 2019-07-18 ENCOUNTER — Ambulatory Visit (INDEPENDENT_AMBULATORY_CARE_PROVIDER_SITE_OTHER): Payer: 59 | Admitting: Family Medicine

## 2019-07-18 ENCOUNTER — Encounter: Payer: Self-pay | Admitting: Family Medicine

## 2019-07-18 VITALS — BP 138/82 | HR 77 | Temp 97.6°F | Wt 144.2 lb

## 2019-07-18 DIAGNOSIS — F411 Generalized anxiety disorder: Secondary | ICD-10-CM

## 2019-07-18 DIAGNOSIS — M549 Dorsalgia, unspecified: Secondary | ICD-10-CM

## 2019-07-18 DIAGNOSIS — I10 Essential (primary) hypertension: Secondary | ICD-10-CM

## 2019-07-18 NOTE — Progress Notes (Signed)
Subjective:  Patient ID: Jenny Davidson, female    DOB: 09-25-1960  Age: 58 y.o. MRN: [id number]  CC:  Chief Complaint  Patient presents with  . Anxiety    6 week f/u on  Patient stated she is doing much better with the meds PHQ9=9 GAD7=8    HPI Seleny Allbright presents for   Anxiety: Generalized anxiety disorder with insomnia.  Discussed October 26, start fluoxetine 20 mg daily.  Potential stress management discussed but also importance of meeting with counselor discussed.  Hydroxyzine nightly as needed for insomnia.  flouxetine has helped. Things not bothering her as bad. Only a few panic/anxiety attacks.  Slight decreased energy - no other new side effects.  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  Hydroxyzine 1-2 night sper week if needed. Working well.    Back pain better, only after certain activities.  More mindful of posture. No new symptoms.   Thought to have cataracts, no other retina concerns. Consultation with surgeon this week.   Hypertension: norvasc 75m qd.  No new side effects with meds.  Home readings:114/72 this am.   BP Readings from Last 3 Encounters:  07/18/19 138/82  06/06/19 138/83  05/16/19 140/90   Lab Results  Component Value Date   CREATININE 0.72 05/16/2019    History Patient Active Problem List   Diagnosis Date Noted  . Seasonal allergies 12/09/2013  . Osteopenia 12/09/2013   Past Medical History:  Diagnosis Date  . Allergy   . Arthritis   . Osteoporosis    Past Surgical History:  Procedure Laterality Date  . ABDOMINAL HYSTERECTOMY    . BREAST SURGERY    . TUBAL LIGATION    . tumore removed  2008   Allergies  Allergen Reactions  . Erythromycin Nausea And Vomiting  . Iodine Rash   Prior to Admission medications   Medication Sig Start Date End Date Taking? Authorizing Provider  amLODipine (NORVASC) 5 MG tablet Take 1 tablet (5 mg total) by mouth daily. 05/16/19  Yes GWendie Agreste MD   fexofenadine (ALLEGRA) 60 MG tablet Take 60 mg by mouth 2 (two) times daily.   Yes [provider]  FLUoxetine (PROZAC) 20 MG tablet Take 1 tablet (20 mg total) by mouth daily. 06/06/19  Yes GWendie Agreste MD  hydrOXYzine (ATARAX/VISTARIL) 25 MG tablet Take 0.5-1 tablets (12.5-25 mg total) by mouth at bedtime as needed. 06/06/19  Yes GWendie Agreste MD   Social History   Socioeconomic History  . Marital status: Married    Spouse name: Not on file  . Number of children: Not on file  . Years of education: Not on file  . Highest education level: Not on file  Occupational History  . Not on file  Social Needs  . Financial resource strain: Not on file  . Food insecurity    Worry: Not on file    Inability: Not on file  . Transportation needs    Medical: Not on file    Non-medical: Not on file  Tobacco Use  . Smoking status: Former SResearch scientist (life sciences) . Smokeless tobacco: Never Used  Substance and Sexual Activity  . Alcohol use: Yes  . Drug use: Not on file  . Sexual activity: Not on file  Lifestyle  . Physical activity    Days per week: Not on file    Minutes per session: Not on file  . Stress: Not on file  Relationships  .  Social Herbalist on phone: Not on file    Gets together: Not on file    Attends religious service: Not on file    Active member of club or organization: Not on file    Attends meetings of clubs or organizations: Not on file    Relationship status: Not on file  . Intimate partner violence    Fear of current or ex partner: Not on file    Emotionally abused: Not on file    Physically abused: Not on file    Forced sexual activity: Not on file  Other Topics Concern  . Not on file  Social History Narrative  . Not on file    Review of Systems  Constitutional: Negative for fatigue and unexpected weight change.  Respiratory: Negative for chest tightness and shortness of breath.   Cardiovascular: Negative for chest pain, palpitations and leg  swelling.  Gastrointestinal: Negative for abdominal pain and blood in stool.  Neurological: Negative for dizziness, syncope, light-headedness and headaches.     Objective:   Vitals:   07/18/19 1709 07/18/19 1812  BP: (!) 152/92 138/82  Pulse: 77   Temp: 97.6 F (36.4 C)   TempSrc: Oral   SpO2: 98%   Weight: 144 lb 3.2 oz (65.4 kg)      Physical Exam Constitutional:      Appearance: Normal appearance. She is well-developed.  HENT:     Head: Normocephalic and atraumatic.  Neck:     Comments: No thyromegaly.  Pulmonary:     Effort: Pulmonary effort is normal.  Abdominal:     General: There is no distension.     Palpations: Abdomen is soft.     Tenderness: There is no abdominal tenderness. There is no guarding or rebound.  Skin:    General: Skin is warm.  Neurological:     Mental Status: She is alert and oriented to person, place, and time.  Psychiatric:        Behavior: Behavior normal.        Assessment & Plan:  Jenny Davidson is a 58 y.o. female . Generalized anxiety disorder  -Improved on fluoxetine.  Option of 40 mg dosing discussed if persistent anxiety/panic attacks.  Plan to call if she does increase dose.  Continue hydroxyzine as needed for sleep.  Essential hypertension  -Stable on current regimen.  No changes  Mid back pain  -Improving.  Continued home treatment discussed with RTC precautions  No orders of the defined types were placed in this encounter.  Patient Instructions   Okay to continue same dose of fluoxetine OR  if increased anxiety symptoms can double to 40 mg/day.  Let me know if you do make that change.  Continue hydroxyzine as needed for sleep.  No other change in medications.  Follow-up in 4 months but let me know if there are questions sooner.  Take care.   If you have lab work done today you will be contacted with your lab results within the next 2 weeks.  If you have not heard from [place] then please contact us. The fastest way to get  your results is to register for My Chart.   IF you received an x-ray today, you will receive an invoice from [facility]. Please contact [facility] at [phone] with questions or concerns regarding your invoice.   IF you received labwork today, you will receive an invoice from [place]. Please contact [facility] at [phone] with questions or concerns regarding your  invoice.   Our billing staff will not be able to assist you with questions regarding bills from these companies.  You will be contacted with the lab results as soon as they are available. The fastest way to get your results is to activate your My Chart account. Instructions are located on the last page of this paperwork. If you have not heard from [place] regarding the results in 2 weeks, please contact this office.          Signed, Merri Ray, MD [facility]

## 2019-07-18 NOTE — Patient Instructions (Addendum)
Okay to continue same dose of fluoxetine OR  if increased anxiety symptoms can double to 40 mg/day.  Let me know if you do make that change.  Continue hydroxyzine as needed for sleep.  No other change in medications.  Follow-up in 4 months but let me know if there are questions sooner.  Take care.   If you have lab work done today you will be contacted with your lab results within the next 2 weeks.  If you have not heard from [place] then please contact us. The fastest way to get your results is to register for My Chart.   IF you received an x-ray today, you will receive an invoice from [facility]. Please contact [facility] at [phone] with questions or concerns regarding your invoice.   IF you received labwork today, you will receive an invoice from [place]. Please contact [facility] at [phone] with questions or concerns regarding your invoice.   Our billing staff will not be able to assist you with questions regarding bills from these companies.  You will be contacted with the lab results as soon as they are available. The fastest way to get your results is to activate your My Chart account. Instructions are located on the last page of this paperwork. If you have not heard from [place] regarding the results in 2 weeks, please contact this office.

## 2019-07-19 ENCOUNTER — Encounter: Payer: Self-pay | Admitting: Family Medicine

## 2019-08-12 HISTORY — PX: CATARACT EXTRACTION, BILATERAL: SHX1313

## 2019-09-26 ENCOUNTER — Telehealth: Payer: Self-pay | Admitting: Family Medicine

## 2019-09-26 DIAGNOSIS — F411 Generalized anxiety disorder: Secondary | ICD-10-CM

## 2019-09-26 NOTE — Telephone Encounter (Signed)
Pt requesting med refill for fluoxetine. Requesting dosage to be 40 mg or a 90 day supply of the 20 mg, please advise  Pharmacy confirmed [facility] on [address]

## 2019-09-27 MED ORDER — FLUOXETINE HCL 20 MG PO TABS: 40.0000 mg | ORAL_TABLET | Freq: Every day | ORAL | 1 refills | Status: DC

## 2019-09-27 NOTE — Telephone Encounter (Signed)
Plan discussed in December. 40mg  dose sent.

## 2019-10-20 ENCOUNTER — Ambulatory Visit: Payer: Self-pay | Attending: Internal Medicine

## 2019-10-20 DIAGNOSIS — Z23 Encounter for immunization: Secondary | ICD-10-CM

## 2019-10-20 NOTE — Progress Notes (Signed)
   Covid-19 Vaccination Clinic  Name:  Fynley Chrystal    MRN: [id number] DOB: 1961/03/14  10/20/2019  Ms. Subramaniam was observed post Covid-19 immunization for 15 minutes without incident. She was provided with Vaccine Information Sheet and instruction to access the V-Safe system.   Ms. Salvador was instructed to call 911 with any severe reactions post vaccine: Marland Kitchen Difficulty breathing  . Swelling of face and throat  . A fast heartbeat  . A bad rash all over body  . Dizziness and weakness   Immunizations Administered    Name Date Dose VIS Date Route   Pfizer COVID-19 Vaccine 10/20/2019  9:19 AM 0.3 mL 07/22/2019 Intramuscular   Manufacturer: [employer], [employer]   Lot: [id number]   NDC: [address]-0

## 2019-11-08 ENCOUNTER — Other Ambulatory Visit: Payer: Self-pay | Admitting: Family Medicine

## 2019-11-08 DIAGNOSIS — F411 Generalized anxiety disorder: Secondary | ICD-10-CM

## 2019-11-14 ENCOUNTER — Ambulatory Visit: Payer: Self-pay | Attending: Internal Medicine

## 2019-11-14 DIAGNOSIS — Z23 Encounter for immunization: Secondary | ICD-10-CM

## 2019-11-14 NOTE — Progress Notes (Signed)
   Covid-19 Vaccination Clinic  Name:  Lindley Stachnik    MRN: [id number] DOB: 27-Jan-1961  11/14/2019  Ms. Fleisher was observed post Covid-19 immunization for 15 minutes without incident. She was provided with Vaccine Information Sheet and instruction to access the V-Safe system.   Ms. Zabawa was instructed to call 911 with any severe reactions post vaccine: Marland Kitchen Difficulty breathing  . Swelling of face and throat  . A fast heartbeat  . A bad rash all over body  . Dizziness and weakness   Immunizations Administered    Name Date Dose VIS Date Route   Pfizer COVID-19 Vaccine 11/14/2019  2:39 PM 0.3 mL 07/22/2019 Intramuscular   Manufacturer: [employer], [employer]   Lot: [id number]   NDC: [address]-4

## 2019-11-17 ENCOUNTER — Other Ambulatory Visit: Payer: Self-pay

## 2019-11-17 ENCOUNTER — Ambulatory Visit (INDEPENDENT_AMBULATORY_CARE_PROVIDER_SITE_OTHER): Payer: 59 | Admitting: Family Medicine

## 2019-11-17 ENCOUNTER — Encounter: Payer: Self-pay | Admitting: Family Medicine

## 2019-11-17 VITALS — BP 124/82 | HR 86 | Temp 98.2°F | Ht 63.5 in | Wt 145.0 lb

## 2019-11-17 DIAGNOSIS — H538 Other visual disturbances: Secondary | ICD-10-CM

## 2019-11-17 DIAGNOSIS — Z1211 Encounter for screening for malignant neoplasm of colon: Secondary | ICD-10-CM | POA: Diagnosis not present

## 2019-11-17 DIAGNOSIS — I10 Essential (primary) hypertension: Secondary | ICD-10-CM | POA: Diagnosis not present

## 2019-11-17 DIAGNOSIS — Z1231 Encounter for screening mammogram for malignant neoplasm of breast: Secondary | ICD-10-CM | POA: Diagnosis not present

## 2019-11-17 DIAGNOSIS — F411 Generalized anxiety disorder: Secondary | ICD-10-CM

## 2019-11-17 DIAGNOSIS — F5104 Psychophysiologic insomnia: Secondary | ICD-10-CM

## 2019-11-17 MED ORDER — HYDROXYZINE HCL 25 MG PO TABS: 12.5000 mg | ORAL_TABLET | Freq: Every evening | ORAL | 1 refills | Status: DC | PRN

## 2019-11-17 MED ORDER — AMLODIPINE BESYLATE 5 MG PO TABS: 5.0000 mg | ORAL_TABLET | Freq: Every day | ORAL | 1 refills | Status: DC

## 2019-11-17 NOTE — Progress Notes (Signed)
Subjective:  Patient ID: Lucy Antigua, female    DOB: [birth year]  Age: 59 y.o. MRN: [id number]  CC:  Chief Complaint  Patient presents with  . Follow-up    hypertension and anxiet. pt hasn't had any issues with her BP at home. PT checks her BP 1-2x per week and reports it's nomral within normal; rang. pt has been doing well with her neew anxiety medication, but dose have some bad days. pt has more good days than bad. pt states her husband noticed since she started taking the higher dose of her PRozac she has had body jerks more so with her legs pt kinda discribs reslis leg syndrom.  . eye issue    pt states she resently had some eye prcedjurs done, and pt  has resently her L eye the vision is cloudy with floters. pt states this started yesterday.     HPI Malala Kasa presents for   L eye issue: Had cataract repair - R in January and Left in February. Had hole in  R retina - seen by retinal specialist. Few weeks ago had retinal repair. At retina specialist 2 days ago.  Feeling ok until yesterday. Left eye had more floaters and blurry since yesterday. No dark spots/amaurosis.  Has not called eye specialist about these symptoms.  Hypertension: norvasc '5mg'$  qd. No new side effects.  Home readings: normal at physical therapy appointments.   BP Readings from Last 3 Encounters:  11/17/19 124/82  07/18/19 138/82  06/06/19 138/83   Lab Results  Component Value Date   CREATININE 0.72 05/16/2019   Generalized anxiety disorder: Discussed in 07/18/19. prozac '20mg'$  at that time. Option of '40mg'$  - did increase to '40mg'$ . On '40mg'$  dose few months. Sometimes helpful. More anxiety some days. Has not met with therapist - trouble scheduling with holidays.  Husband noticed jerking of legs at times when sleeping - seems to be present since taking anxiety meds. Jumpy feeling in legs  When reading - notices legs jerk at times. More good days than bad. Sleeping better, trouble getting to sleep after late shift  at work.  Only rare use of hydroxyzine - none past 2 weeks.    GAD 7 : Generalized Anxiety Score 11/17/2019 07/18/2019 06/06/2019 05/16/2019  Nervous, Anxious, on Edge '3 1 1 2  '$ Control/stop worrying '3 1 3 3  '$ Worry too much - different things '3 1 3 3  '$ Trouble relaxing '2 2 2 3  '$ Restless '1 1 1 3  '$ Easily annoyed or irritable 0 '1 1 2  '$ Afraid - awful might happen '1 1 3 3  '$ Total GAD 7 Score '13 8 14 19  '$ Anxiety Difficulty - - Somewhat difficult Somewhat difficult   HM: Agrees to colonoscopy referral, mammogram referral. Will follow up for pap and labs in next few weeks. Agrees to referral for Mammogram.   History Patient Active Problem List   Diagnosis Date Noted  . Seasonal allergies 12/09/2013  . Osteopenia 12/09/2013   Past Medical History:  Diagnosis Date  . Allergy   . Arthritis   . Osteoporosis    Past Surgical History:  Procedure Laterality Date  . ABDOMINAL HYSTERECTOMY    . BREAST SURGERY    . TUBAL LIGATION    . tumore removed  2008   Allergies  Allergen Reactions  . Erythromycin Nausea And Vomiting  . Iodine Rash   Prior to Admission medications   Medication Sig Start Date End Date Taking? Authorizing Provider  amLODipine (NORVASC) 5 MG  tablet Take 1 tablet (5 mg total) by mouth daily. 05/16/19  Yes Wendie Agreste, MD  fexofenadine (ALLEGRA) 60 MG tablet Take 60 mg by mouth 2 (two) times daily.   Yes [provider]  FLUoxetine (PROZAC) 20 MG tablet Take 2 tablets (40 mg total) by mouth daily. 09/27/19  Yes Wendie Agreste, MD  hydrOXYzine (ATARAX/VISTARIL) 25 MG tablet Take 0.5-1 tablets (12.5-25 mg total) by mouth at bedtime as needed. 06/06/19  Yes Wendie Agreste, MD   Social History   Socioeconomic History  . Marital status: Married    Spouse name: Not on file  . Number of children: Not on file  . Years of education: Not on file  . Highest education level: Not on file  Occupational History  . Not on file  Tobacco Use  . Smoking status:  Former Research scientist (life sciences)  . Smokeless tobacco: Never Used  Substance and Sexual Activity  . Alcohol use: Yes  . Drug use: Not on file  . Sexual activity: Not on file  Other Topics Concern  . Not on file  Social History Narrative  . Not on file   Social Determinants of Health   Financial Resource Strain:   . Difficulty of Paying Living Expenses:   Food Insecurity:   . Worried About Charity fundraiser in the Last Year:   . Arboriculturist in the Last Year:   Transportation Needs:   . Film/video editor (Medical):   Marland Kitchen Lack of Transportation (Non-Medical):   Physical Activity:   . Days of Exercise per Week:   . Minutes of Exercise per Session:   Stress:   . Feeling of Stress :   Social Connections:   . Frequency of Communication with Friends and Family:   . Frequency of Social Gatherings with Friends and Family:   . Attends Religious Services:   . Active Member of Clubs or Organizations:   . Attends Archivist Meetings:   Marland Kitchen Marital Status:   Intimate Partner Violence:   . Fear of Current or Ex-Partner:   . Emotionally Abused:   Marland Kitchen Physically Abused:   . Sexually Abused:     Review of Systems   Objective:   Vitals:   11/17/19 1648 11/17/19 1653  BP: (!) 143/86 124/82  Pulse: 86   Temp: 98.2 F (36.8 C)   TempSrc: Temporal   SpO2: 97%   Weight: 145 lb (65.8 kg)   Height: 5' 3.5" (1.613 m)      Physical Exam Vitals reviewed.  Constitutional:      Appearance: She is well-developed.  HENT:     Head: Normocephalic and atraumatic.  Eyes:     General: Lids are normal.     Extraocular Movements:     Right eye: Normal extraocular motion.     Left eye: Normal extraocular motion.     Conjunctiva/sclera: Conjunctivae normal.     Right eye: Right conjunctiva is not injected.     Left eye: Left conjunctiva is not injected.     Pupils: Pupils are equal, round, and reactive to light.     Comments: Anterior chambers.  Clear, EOMI.  Neck:     Vascular: No  carotid bruit.  Cardiovascular:     Rate and Rhythm: Normal rate and regular rhythm.     Heart sounds: Normal heart sounds.  Pulmonary:     Effort: Pulmonary effort is normal.     Breath sounds: Normal breath sounds.  Abdominal:  Palpations: Abdomen is soft. There is no pulsatile mass.     Tenderness: There is no abdominal tenderness.  Skin:    General: Skin is warm and dry.  Neurological:     General: No focal deficit present.     Mental Status: She is alert and oriented to person, place, and time.     Cranial Nerves: No dysarthria or facial asymmetry.     Motor: No tremor or pronator drift.  Psychiatric:        Behavior: Behavior normal.        Assessment & Plan:  Addilynn Mowrer is a 59 y.o. female . Essential hypertension - Plan: amLODipine (NORVASC) 5 MG tablet  - Stable, tolerating current regimen. Medications refilled. Labs plan for next visit in few weeks.  Special screening for malignant neoplasms, colon - Plan: Ambulatory referral to Gastroenterology  Blurry vision, left eye  -Past few days with some increased floaters, advised her to call her ophthalmologist right away given prior procedures.  ER precautions if acute worsening overnight.  Encounter for screening mammogram for malignant neoplasm of breast - Plan: MM Digital Screening  Generalized anxiety disorder Psychophysiological insomnia - Plan: hydrOXYzine (ATARAX/VISTARIL) 25 MG tablet  -Variable control.  Decided to remain on same dose at 40 mg of fluoxetine for now, but did recommend meeting with therapist/counseling.  Also given option of low-dose hydroxyzine at bedtime, potentially during the day if needed for flare of anxiety symptoms.  Can try hydroxyzine at night this weekend to see if she does have daytime somnolence.    -Leg movement differential includes restless leg syndrome but reports some symptoms during the day at rest.  No tremor on exam, nonfocal neuro exam.  If the symptoms continue, may  consider neuro eval.  Recheck next few weeks, sooner if worse.  Health maintenance Mammogram, colonoscopy ordered.  Plans for repeat visit few weeks for Pap testing. Meds ordered this encounter  Medications  . amLODipine (NORVASC) 5 MG tablet    Sig: Take 1 tablet (5 mg total) by mouth daily.    Dispense:  90 tablet    Refill:  1  . hydrOXYzine (ATARAX/VISTARIL) 25 MG tablet    Sig: Take 0.5-1 tablets (12.5-25 mg total) by mouth at bedtime as needed.    Dispense:  30 tablet    Refill:  1   Patient Instructions   Okay to continue same dose of fluoxetine for now.  I do recommend trying a low-dose of hydroxyzine at bedtime to see if that helps with sleep.  He can try that on the weekend to see if you feel sleepy the next morning.  I do recommend following up with therapist. Here are a few options for counseling:  [facility]:  [place] [phone]   Please call your eye care provider soon as possible to discuss blurry vision and floaters.  If any darkening of vision or any acute changes be evaluated in the emergency room.  No change in blood pressure medications for now.  Follow-up in the next few weeks and we can do fasting blood work at that time as well Pap testing, and discuss leg symptoms further if those are still present.  Return to the clinic or go to the nearest emergency room if any of your symptoms worsen or new symptoms occur.   Managing Anxiety, Adult After being diagnosed with an anxiety disorder, you may be relieved to know why you have felt or behaved a certain way. You  may also feel overwhelmed about the treatment ahead and what it will mean for your life. With care and support, you can manage this condition and recover from it. How to manage lifestyle changes Managing stress and anxiety  Stress is your body's reaction to life changes and events, both good and bad. Most stress will last just a few hours,  but stress can be ongoing and can lead to more than just stress. Although stress can play a major role in anxiety, it is not the same as anxiety. Stress is usually caused by something external, such as a deadline, test, or competition. Stress normally passes after the triggering event has ended.  Anxiety is caused by something internal, such as imagining a terrible outcome or worrying that something will go wrong that will devastate you. Anxiety often does not go away even after the triggering event is over, and it can become long-term (chronic) worry. It is important to understand the differences between stress and anxiety and to manage your stress effectively so that it does not lead to an anxious response. Talk with your health care provider or a counselor to learn more about reducing anxiety and stress. He or she may suggest tension reduction techniques, such as:  Music therapy. This can include creating or listening to music that you enjoy and that inspires you.  Mindfulness-based meditation. This involves being aware of your normal breaths while not trying to control your breathing. It can be done while sitting or walking.  Centering prayer. This involves focusing on a word, phrase, or sacred image that means something to you and brings you peace.  Deep breathing. To do this, expand your stomach and inhale slowly through your nose. Hold your breath for 3-5 seconds. Then exhale slowly, letting your stomach muscles relax.  Self-talk. This involves identifying thought patterns that lead to anxiety reactions and changing those patterns.  Muscle relaxation. This involves tensing muscles and then relaxing them. Choose a tension reduction technique that suits your lifestyle and personality. These techniques take time and practice. Set aside 5-15 minutes a day to do them. Therapists can offer counseling and training in these techniques. The training to help with anxiety may be covered by some insurance  plans. Other things you can do to manage stress and anxiety include:  Keeping a stress/anxiety diary. This can help you learn what triggers your reaction and then learn ways to manage your response.  Thinking about how you react to certain situations. You may not be able to control everything, but you can control your response.  Making time for activities that help you relax and not feeling guilty about spending your time in this way.  Visual imagery and yoga can help you stay calm and relax.  Medicines Medicines can help ease symptoms. Medicines for anxiety include:  Anti-anxiety drugs.  Antidepressants. Medicines are often used as a primary treatment for anxiety disorder. Medicines will be prescribed by a health care provider. When used together, medicines, psychotherapy, and tension reduction techniques may be the most effective treatment. Relationships Relationships can play a big part in helping you recover. Try to spend more time connecting with trusted friends and family members. Consider going to couples counseling, taking family education classes, or going to family therapy. Therapy can help you and others better understand your condition. How to recognize changes in your anxiety Everyone responds differently to treatment for anxiety. Recovery from anxiety happens when symptoms decrease and stop interfering with your daily activities at home or  work. This may mean that you will start to:  Have better concentration and focus. Worry will interfere less in your daily thinking.  Sleep better.  Be less irritable.  Have more energy.  Have improved memory. It is important to recognize when your condition is getting worse. Contact your health care provider if your symptoms interfere with home or work and you feel like your condition is not improving. Follow these instructions at home: Activity  Exercise. Most adults should do the following: ? Exercise for at least 150 minutes  each week. The exercise should increase your heart rate and make you sweat (moderate-intensity exercise). ? Strengthening exercises at least twice a week.  Get the right amount and quality of sleep. Most adults need 7-9 hours of sleep each night. Lifestyle   Eat a healthy diet that includes plenty of vegetables, fruits, whole grains, low-fat dairy products, and lean protein. Do not eat a lot of foods that are high in solid fats, added sugars, or salt.  Make choices that simplify your life.  Do not use any products that contain nicotine or tobacco, such as cigarettes, e-cigarettes, and chewing tobacco. If you need help quitting, ask your health care provider.  Avoid caffeine, alcohol, and certain over-the-counter cold medicines. These may make you feel worse. Ask your pharmacist which medicines to avoid. General instructions  Take over-the-counter and prescription medicines only as told by your health care provider.  Keep all follow-up visits as told by your health care provider. This is important. Where to find support You can get help and support from these sources:  Self-help groups.  Online and [employer].  A trusted spiritual leader.  Couples counseling.  Family education classes.  Family therapy. Where to find more information You may find that joining a support group helps you deal with your anxiety. The following sources can help you locate counselors or support groups near you:  [place]: www.mentalhealth[place]ica.net  Anxiety and Depression Association of [place] (ADAA): https://www.clark.net/  National Alliance on Mental Illness (NAMI): www.nami.org Contact a health care provider if you:  Have a hard time staying focused or finishing daily tasks.  Spend many hours a day feeling worried about everyday life.  Become exhausted by worry.  Start to have headaches, feel tense, or have nausea.  Urinate more than normal.  Have diarrhea. Get help  right away if you have:  A racing heart and shortness of breath.  Thoughts of hurting yourself or others. If you ever feel like you may hurt yourself or others, or have thoughts about taking your own life, get help right away. You can go to your nearest emergency department or call:  Your local emergency services (911 in the [place]  A suicide crisis helpline, such as the [place] at 312-150-5140. This is open 24 hours a day. Summary  Taking steps to learn and use tension reduction techniques can help calm you and help prevent triggering an anxiety reaction.  When used together, medicines, psychotherapy, and tension reduction techniques may be the most effective treatment.  Family, friends, and partners can play a big part in helping you recover from an anxiety disorder. This information is not intended to replace advice given to you by your health care provider. Make sure you discuss any questions you have with your health care provider. Document Revised: 12/28/2018 Document Reviewed: 12/28/2018 Elsevier Patient Education  [employer].     If you have lab work done today you will be  contacted with your lab results within the next 2 weeks.  If you have not heard from [place] then please contact us. The fastest way to get your results is to register for My Chart.   IF you received an x-ray today, you will receive an invoice from [facility]. Please contact [facility] at [phone] with questions or concerns regarding your invoice.   IF you received labwork today, you will receive an invoice from [facility]. Please contact [facility] at [phone] with questions or concerns regarding your invoice.   Our billing staff will not be able to assist you with questions regarding bills from these companies.  You will be contacted with the lab results as soon as they are available. The fastest way to get your results is to activate your My Chart  account. Instructions are located on the last page of this paperwork. If you have not heard from [place] regarding the results in 2 weeks, please contact this office.         Signed, Merri Ray, MD [facility]

## 2019-11-17 NOTE — Patient Instructions (Addendum)
Okay to continue same dose of fluoxetine for now.  I do recommend trying a low-dose of hydroxyzine at bedtime to see if that helps with sleep.  He can try that on the weekend to see if you feel sleepy the next morning.  I do recommend following up with therapist. Here are a few options for counseling:  [facility]:  [phone]  [facility] [phone]   Please call your eye care provider soon as possible to discuss blurry vision and floaters.  If any darkening of vision or any acute changes be evaluated in the emergency room.  No change in blood pressure medications for now.  Follow-up in the next few weeks and we can do fasting blood work at that time as well Pap testing, and discuss leg symptoms further if those are still present.  Return to the clinic or go to the nearest emergency room if any of your symptoms worsen or new symptoms occur.   Managing Anxiety, Adult After being diagnosed with an anxiety disorder, you may be relieved to know why you have felt or behaved a certain way. You may also feel overwhelmed about the treatment ahead and what it will mean for your life. With care and support, you can manage this condition and recover from it. How to manage lifestyle changes Managing stress and anxiety  Stress is your body's reaction to life changes and events, both good and bad. Most stress will last just a few hours, but stress can be ongoing and can lead to more than just stress. Although stress can play a major role in anxiety, it is not the same as anxiety. Stress is usually caused by something external, such as a deadline, test, or competition. Stress normally passes after the triggering event has ended.  Anxiety is caused by something internal, such as imagining a terrible outcome or worrying that something will go wrong that will devastate you. Anxiety often does not go away even after the triggering event is over, and it can become  long-term (chronic) worry. It is important to understand the differences between stress and anxiety and to manage your stress effectively so that it does not lead to an anxious response. Talk with your health care provider or a counselor to learn more about reducing anxiety and stress. He or she may suggest tension reduction techniques, such as:  Music therapy. This can include creating or listening to music that you enjoy and that inspires you.  Mindfulness-based meditation. This involves being aware of your normal breaths while not trying to control your breathing. It can be done while sitting or walking.  Centering prayer. This involves focusing on a word, phrase, or sacred image that means something to you and brings you peace.  Deep breathing. To do this, expand your stomach and inhale slowly through your nose. Hold your breath for 3-5 seconds. Then exhale slowly, letting your stomach muscles relax.  Self-talk. This involves identifying thought patterns that lead to anxiety reactions and changing those patterns.  Muscle relaxation. This involves tensing muscles and then relaxing them. Choose a tension reduction technique that suits your lifestyle and personality. These techniques take time and practice. Set aside 5-15 minutes a day to do them. Therapists can offer counseling and training in these techniques. The training to help with anxiety may be covered by some insurance plans. Other things you can do to manage stress and anxiety include:  Keeping a stress/anxiety diary. This can help you learn what triggers your reaction  and then learn ways to manage your response.  Thinking about how you react to certain situations. You may not be able to control everything, but you can control your response.  Making time for activities that help you relax and not feeling guilty about spending your time in this way.  Visual imagery and yoga can help you stay calm and relax.  Medicines Medicines can  help ease symptoms. Medicines for anxiety include:  Anti-anxiety drugs.  Antidepressants. Medicines are often used as a primary treatment for anxiety disorder. Medicines will be prescribed by a health care provider. When used together, medicines, psychotherapy, and tension reduction techniques may be the most effective treatment. Relationships Relationships can play a big part in helping you recover. Try to spend more time connecting with trusted friends and family members. Consider going to couples counseling, taking family education classes, or going to family therapy. Therapy can help you and others better understand your condition. How to recognize changes in your anxiety Everyone responds differently to treatment for anxiety. Recovery from anxiety happens when symptoms decrease and stop interfering with your daily activities at home or work. This may mean that you will start to:  Have better concentration and focus. Worry will interfere less in your daily thinking.  Sleep better.  Be less irritable.  Have more energy.  Have improved memory. It is important to recognize when your condition is getting worse. Contact your health care provider if your symptoms interfere with home or work and you feel like your condition is not improving. Follow these instructions at home: Activity  Exercise. Most adults should do the following: ? Exercise for at least 150 minutes each week. The exercise should increase your heart rate and make you sweat (moderate-intensity exercise). ? Strengthening exercises at least twice a week.  Get the right amount and quality of sleep. Most adults need 7-9 hours of sleep each night. Lifestyle   Eat a healthy diet that includes plenty of vegetables, fruits, whole grains, low-fat dairy products, and lean protein. Do not eat a lot of foods that are high in solid fats, added sugars, or salt.  Make choices that simplify your life.  Do not use any products that  contain nicotine or tobacco, such as cigarettes, e-cigarettes, and chewing tobacco. If you need help quitting, ask your health care provider.  Avoid caffeine, alcohol, and certain over-the-counter cold medicines. These may make you feel worse. Ask your pharmacist which medicines to avoid. General instructions  Take over-the-counter and prescription medicines only as told by your health care provider.  Keep all follow-up visits as told by your health care provider. This is important. Where to find support You can get help and support from these sources:  Self-help groups.  Online and [employer].  A trusted spiritual leader.  Couples counseling.  Family education classes.  Family therapy. Where to find more information You may find that joining a support group helps you deal with your anxiety. The following sources can help you locate counselors or support groups near you:  [place]: www.mentalhealthamerica.net  Anxiety and Depression Association of [place] (ADAA): https://www.clark.net/  National Alliance on Mental Illness (NAMI): www.nami.org Contact a health care provider if you:  Have a hard time staying focused or finishing daily tasks.  Spend many hours a day feeling worried about everyday life.  Become exhausted by worry.  Start to have headaches, feel tense, or have nausea.  Urinate more than normal.  Have diarrhea. Get help right away if  you have:  A racing heart and shortness of breath.  Thoughts of hurting yourself or others. If you ever feel like you may hurt yourself or others, or have thoughts about taking your own life, get help right away. You can go to your nearest emergency department or call:  Your local emergency services (911 in the [place]  A suicide crisis helpline, such as the National Suicide Prevention Lifeline at 8301898851. This is open 24 hours a day. Summary  Taking steps to learn and use tension reduction  techniques can help calm you and help prevent triggering an anxiety reaction.  When used together, medicines, psychotherapy, and tension reduction techniques may be the most effective treatment.  Family, friends, and partners can play a big part in helping you recover from an anxiety disorder. This information is not intended to replace advice given to you by your health care provider. Make sure you discuss any questions you have with your health care provider. Document Revised: 12/28/2018 Document Reviewed: 12/28/2018 Elsevier Patient Education  [employer].     If you have lab work done today you will be contacted with your lab results within the next 2 weeks.  If you have not heard from [place] then please contact us. The fastest way to get your results is to register for My Chart.   IF you received an x-ray today, you will receive an invoice from [facility]. Please contact [facility] at [phone] with questions or concerns regarding your invoice.   IF you received labwork today, you will receive an invoice from [place]. Please contact [facility] at [phone] with questions or concerns regarding your invoice.   Our billing staff will not be able to assist you with questions regarding bills from these companies.  You will be contacted with the lab results as soon as they are available. The fastest way to get your results is to activate your My Chart account. Instructions are located on the last page of this paperwork. If you have not heard from [place] regarding the results in 2 weeks, please contact this office.

## 2019-12-08 ENCOUNTER — Other Ambulatory Visit (HOSPITAL_COMMUNITY)
Admission: RE | Admit: 2019-12-08 | Discharge: 2019-12-08 | Disposition: A | Discharge status: Home / Self Care | Payer: 59 | Source: Ambulatory Visit | Attending: Family Medicine | Admitting: Family Medicine

## 2019-12-08 ENCOUNTER — Other Ambulatory Visit: Payer: Self-pay

## 2019-12-08 ENCOUNTER — Ambulatory Visit (INDEPENDENT_AMBULATORY_CARE_PROVIDER_SITE_OTHER): Payer: 59 | Admitting: Family Medicine

## 2019-12-08 ENCOUNTER — Encounter: Payer: Self-pay | Admitting: Family Medicine

## 2019-12-08 VITALS — BP 137/87 | HR 79 | Temp 98.8°F | Ht 63.5 in | Wt 147.0 lb

## 2019-12-08 DIAGNOSIS — Z124 Encounter for screening for malignant neoplasm of cervix: Secondary | ICD-10-CM | POA: Diagnosis not present

## 2019-12-08 DIAGNOSIS — R258 Other abnormal involuntary movements: Secondary | ICD-10-CM | POA: Diagnosis not present

## 2019-12-08 DIAGNOSIS — I10 Essential (primary) hypertension: Secondary | ICD-10-CM | POA: Diagnosis not present

## 2019-12-08 DIAGNOSIS — G259 Extrapyramidal and movement disorder, unspecified: Secondary | ICD-10-CM | POA: Diagnosis not present

## 2019-12-08 DIAGNOSIS — E785 Hyperlipidemia, unspecified: Secondary | ICD-10-CM

## 2019-12-08 NOTE — Patient Instructions (Addendum)
I will refer you to neurology to discuss the leg symptoms.  No med changes for now.  Thank you for coming in today and I will let you know if any concerns on labwork.     If you have lab work done today you will be contacted with your lab results within the next 2 weeks.  If you have not heard from [place] then please contact us. The fastest way to get your results is to register for My Chart.   IF you received an x-ray today, you will receive an invoice from [facility]. Please contact [facility] at [phone] with questions or concerns regarding your invoice.   IF you received labwork today, you will receive an invoice from [place]. Please contact [facility] at [phone] with questions or concerns regarding your invoice.   Our billing staff will not be able to assist you with questions regarding bills from these companies.  You will be contacted with the lab results as soon as they are available. The fastest way to get your results is to activate your My Chart account. Instructions are located on the last page of this paperwork. If you have not heard from [place] regarding the results in 2 weeks, please contact this office.

## 2019-12-08 NOTE — Progress Notes (Signed)
Subjective:  Patient ID: Jenny Davidson, female    DOB: 09-05-1960  Age: 59 y.o. MRN: [id number]  CC:  Chief Complaint  Patient presents with  . Follow-up    on hypertension. pt reports no issues with BP since last visit. pt states she checks it 2-3 times a week. BP home readings have been within normal range per pt. pt reports no physical symptoms. pt would like to get her pap and other labs done today as well.  . jerking leg    pt reports that she has been jerking her legs in her sleep.pt's husband told her since she started her medication the jerking has gotten worse. pt reports that the jerking sometoms her legs will jerk while she awake and just relaxing not when she is active.    HPI Jylian Plata presents for   Hypertension: Last seen April 8, continued on amlodipine 5 mg daily.  Plan for labs today. BP Readings from Last 3 Encounters:  12/08/19 137/87  11/17/19 124/82  07/18/19 138/82   Lab Results  Component Value Date   CREATININE 0.72 05/16/2019   Hyperlipidemia: Initial plan for diet/exercise approach. Gym few times per week.  The 10-year ASCVD risk score Mikey Bussing DC Brooke Bonito., et al., 2013) is: 5.2%   Values used to calculate the score:     Age: 78 years     Sex: Female     Is Non-Hispanic African American: No     Diabetic: No     Tobacco smoker: No     Systolic Blood Pressure: 097 mmHg     Is BP treated: Yes     HDL Cholesterol: 79 mg/dL     Total Cholesterol: 247 mg/dL  Lab Results  Component Value Date   CHOL 247 (H) 05/16/2019   HDL 79 05/16/2019   LDLCALC 153 (H) 05/16/2019   TRIG 89 05/16/2019   CHOLHDL 3.1 05/16/2019   Lab Results  Component Value Date   ALT 13 05/16/2019   AST 20 05/16/2019   ALKPHOS 106 05/16/2019   BILITOT 0.4 05/16/2019    Generalized anxiety disorder Nighttime leg movements discussed at April 8 visit.  Was sleeping better at April its visit but did notice leg jerking at times when sleeping or when reading.  Jumpy feeling.   Noticed since starting anxiety medication.  Has been doing better at 40 mg of Prozac past few months.  Had not yet met with therapist.  Rare use of hydroxyzine.   Still same symptoms with leg movements or whole body movements with sleep, has woken husband.  Notes some leg jerking at rest with reading/tv.   HM: Pap today - normal in past, but over 5 yrs.  Mammogram  - calling to schedule.   History Patient Active Problem List   Diagnosis Date Noted  . Seasonal allergies 12/09/2013  . Osteopenia 12/09/2013   Past Medical History:  Diagnosis Date  . Allergy   . Arthritis   . Osteoporosis    Past Surgical History:  Procedure Laterality Date  . ABDOMINAL HYSTERECTOMY    . BREAST SURGERY    . TUBAL LIGATION    . tumore removed  2008   Allergies  Allergen Reactions  . Erythromycin Nausea And Vomiting  . Iodine Rash   Prior to Admission medications   Medication Sig Start Date End Date Taking? Authorizing Provider  amLODipine (NORVASC) 5 MG tablet Take 1 tablet (5 mg total) by mouth daily. 11/17/19  Yes Wendie Agreste, MD  fexofenadine (ALLEGRA) 60 MG tablet Take 60 mg by mouth 2 (two) times daily.   Yes [provider]  FLUoxetine (PROZAC) 20 MG tablet Take 2 tablets (40 mg total) by mouth daily. 09/27/19  Yes Wendie Agreste, MD  hydrOXYzine (ATARAX/VISTARIL) 25 MG tablet Take 0.5-1 tablets (12.5-25 mg total) by mouth at bedtime as needed. 11/17/19  Yes Wendie Agreste, MD   Social History   Socioeconomic History  . Marital status: Married    Spouse name: Not on file  . Number of children: Not on file  . Years of education: Not on file  . Highest education level: Not on file  Occupational History  . Not on file  Tobacco Use  . Smoking status: Former Research scientist (life sciences)  . Smokeless tobacco: Never Used  Substance and Sexual Activity  . Alcohol use: Yes  . Drug use: Not on file  . Sexual activity: Not on file  Other Topics Concern  . Not on file  Social History  Narrative  . Not on file   Social Determinants of Health   Financial Resource Strain:   . Difficulty of Paying Living Expenses:   Food Insecurity:   . Worried About Charity fundraiser in the Last Year:   . Arboriculturist in the Last Year:   Transportation Needs:   . Film/video editor (Medical):   Marland Kitchen Lack of Transportation (Non-Medical):   Physical Activity:   . Days of Exercise per Week:   . Minutes of Exercise per Session:   Stress:   . Feeling of Stress :   Social Connections:   . Frequency of Communication with Friends and Family:   . Frequency of Social Gatherings with Friends and Family:   . Attends Religious Services:   . Active Member of Clubs or Organizations:   . Attends Archivist Meetings:   Marland Kitchen Marital Status:   Intimate Partner Violence:   . Fear of Current or Ex-Partner:   . Emotionally Abused:   Marland Kitchen Physically Abused:   . Sexually Abused:     Review of Systems  Per HPI.  Objective:   Vitals:   12/08/19 0905  BP: 137/87  Pulse: 79  Temp: 98.8 F (37.1 C)  TempSrc: Temporal  SpO2: 97%  Weight: 147 lb (66.7 kg)  Height: 5' 3.5" (1.613 m)     Physical Exam Vitals reviewed. Exam conducted with a chaperone present.  Constitutional:      Appearance: She is well-developed.  HENT:     Head: Normocephalic and atraumatic.  Eyes:     Conjunctiva/sclera: Conjunctivae normal.     Pupils: Pupils are equal, round, and reactive to light.  Neck:     Vascular: No carotid bruit.  Cardiovascular:     Rate and Rhythm: Normal rate and regular rhythm.     Heart sounds: Normal heart sounds.  Pulmonary:     Effort: Pulmonary effort is normal.     Breath sounds: Normal breath sounds.  Abdominal:     Palpations: Abdomen is soft. There is no pulsatile mass.     Tenderness: There is no abdominal tenderness.  Genitourinary:    General: Normal vulva.     Exam position: Lithotomy position.     Labia:        Right: No rash, tenderness or lesion.          Left: No rash, tenderness or lesion.      Vagina: Normal.     Cervix: No  cervical motion tenderness or friability.     Uterus: Normal.      Adnexa: Right adnexa normal and left adnexa normal.  Skin:    General: Skin is warm and dry.  Neurological:     General: No focal deficit present.     Mental Status: She is alert and oriented to person, place, and time.     Motor: No weakness.     Gait: Gait normal.  Psychiatric:        Behavior: Behavior normal.      Assessment & Plan:  Ronica Vivian is a 59 y.o. female . Essential hypertension - Plan: Lipid panel, Comprehensive metabolic panel  -  Stable, tolerating current regimen. Labs pending as above.   Hyperlipidemia, unspecified hyperlipidemia type - Plan: Lipid panel, Comprehensive metabolic panel  - repeat labs.diet/exercise approach with prior ASCVD risk score   Abnormal leg movement - Plan: Ambulatory referral to Neurology Nocturnal leg movements - Plan: Ambulatory referral to Neurology  - ? PLMS vs tremor. nonfocal exam. Refer to neuro to decide on further testing, possible sleep testing.   Screening for cervical cancer - Plan: Cytology - PAP([facility])  - pap with HPV testing obtained.   No orders of the defined types were placed in this encounter.  Patient Instructions   I will refer you to neurology to discuss the leg symptoms.  No med changes for now.  Thank you for coming in today and I will let you know if any concerns on labwork.     If you have lab work done today you will be contacted with your lab results within the next 2 weeks.  If you have not heard from [place] then please contact us. The fastest way to get your results is to register for My Chart.   IF you received an x-ray today, you will receive an invoice from [facility] Radiology. Please contact [facility] Radiology at [phone] with questions or concerns regarding your invoice.   IF you received labwork today, you will receive an invoice from  [place]. Please contact [facility] at [phone] with questions or concerns regarding your invoice.   Our billing staff will not be able to assist you with questions regarding bills from these companies.  You will be contacted with the lab results as soon as they are available. The fastest way to get your results is to activate your My Chart account. Instructions are located on the last page of this paperwork. If you have not heard from [place] regarding the results in 2 weeks, please contact this office.         Signed, Merri Ray, MD [facility]

## 2019-12-09 LAB — LIPID PANEL
Chol/HDL Ratio: 3 ratio (ref 0.0–4.4)
Cholesterol, Total: 224 mg/dL — ABNORMAL HIGH (ref 100–199)
HDL: 75 mg/dL (ref >39)
LDL Chol Calc (NIH): 138 mg/dL — ABNORMAL HIGH (ref 0–99)
Triglycerides: 66 mg/dL (ref 0–149)
VLDL Cholesterol Cal: 11 mg/dL (ref 5–40)

## 2019-12-09 LAB — COMPREHENSIVE METABOLIC PANEL WITH GFR
ALT: 14 IU/L (ref 0–32)
AST: 20 IU/L (ref 0–40)
Albumin/Globulin Ratio: 1.7 (ref 1.2–2.2)
Albumin: 4.4 g/dL (ref 3.8–4.8)
Alkaline Phosphatase: 112 IU/L (ref 39–117)
BUN/Creatinine Ratio: 16 (ref 12–28)
BUN: 12 mg/dL (ref 8–27)
Bilirubin Total: 0.4 mg/dL (ref 0.0–1.2)
CO2: 24 mmol/L (ref 20–29)
Calcium: 9.2 mg/dL (ref 8.7–10.3)
Chloride: 105 mmol/L (ref 96–106)
Creatinine, Ser: 0.73 mg/dL (ref 0.57–1.00)
GFR calc Af Amer: 103 mL/min/1.73
GFR calc non Af Amer: 89 mL/min/1.73
Globulin, Total: 2.6 g/dL (ref 1.5–4.5)
Glucose: 90 mg/dL (ref 65–99)
Potassium: 4.6 mmol/L (ref 3.5–5.2)
Sodium: 142 mmol/L (ref 134–144)
Total Protein: 7 g/dL (ref 6.0–8.5)

## 2019-12-09 LAB — CYTOLOGY - PAP
Comment: NEGATIVE
Diagnosis: NEGATIVE
High risk HPV: NEGATIVE

## 2019-12-20 ENCOUNTER — Encounter: Payer: Self-pay | Admitting: *Deleted

## 2019-12-27 ENCOUNTER — Encounter: Payer: Self-pay | Admitting: Neurology

## 2019-12-27 ENCOUNTER — Other Ambulatory Visit: Payer: Self-pay

## 2019-12-27 ENCOUNTER — Ambulatory Visit (INDEPENDENT_AMBULATORY_CARE_PROVIDER_SITE_OTHER): Payer: 59 | Admitting: Neurology

## 2019-12-27 VITALS — BP 140/87 | HR 70 | Ht 63.0 in | Wt 146.3 lb

## 2019-12-27 DIAGNOSIS — R253 Fasciculation: Secondary | ICD-10-CM

## 2019-12-27 DIAGNOSIS — G4761 Periodic limb movement disorder: Secondary | ICD-10-CM

## 2019-12-27 NOTE — Progress Notes (Signed)
Subjective:    Patient ID: Jenny Davidson is a 59 y.o. female.  HPI     Huston Foley, MD, PhD [facility] [address], [address] [address] [place], [place] [address]   Dear Dr. Neva Seat,   I saw your patient, Jenny Davidson, upon your kind request, in my sleep clinic today for Initial consultation of her sleep disorder, in particular, abnormal/involuntary leg movements while asleep.  The patient is unaccompanied today.  As you know, Jenny Davidson is a 59 year old right-handed woman with an underlying medical history of arthritis, osteopenia, allergies, and anxiety, who reports Twitching while asleep for the past several months, since after starting her Prozac.  She has been on Prozac since approximately October 2020 and has increased the dose since then to currently 40 mg daily.  The Prozac has helped her anxiety and stress.  She has been advised to seek counseling but has not done so yet.  She denies any telltale symptoms of restless leg syndrome.  She did not have much in the way of twitching before she started the Prozac.  The twitching is reported to her by her husband primarily, she is not aware of it when she is asleep but has had occasional muscle twitching and jerking movements while awake, typically in the evenings when she is sitting.  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  She does not sleep very well.  She has trouble going to sleep, it can take her 2 hours to fall asleep.  For as needed use she has hydroxyzine for anxiety and sometimes takes it for sleep.  She has not tried any over-the-counter sleep aids such as melatonin or p.m. type medications for fear of feeling sleepy the next day if she has to get up early.  She may go to bed between 10 PM and 1 AM.  She has a rise time between 6 AM and at the latest 8:30 AM.  She has significant nocturia about 2-3 times per average night.  She has occasional snoring, typically more  when she sleeps on her back or when she is congested.  She typically is a side sleeper.   I reviewed your office note from 12/08/2019. TSH was normal in October 2020. Her Epworth sleepiness score is 10 out of 24, fatigue severity score is 25 out of 63.    Her Past Medical History Is Significant For: Past Medical History:  Diagnosis Date  . Allergy   . Arthritis   . Osteoporosis     Her Past Surgical History Is Significant For: Past Surgical History:  Procedure Laterality Date  . ABDOMINAL HYSTERECTOMY    . BREAST SURGERY    . TUBAL LIGATION    . tumore removed  2008    Her Family History Is Significant For: Family History  Problem Relation Age of Onset  . Heart disease Father   . Diabetes Father     Her Social History Is Significant For: Social History   Socioeconomic History  . Marital status: Married    Spouse name: Not on file  . Number of children: Not on file  . Years of education: Not on file  . Highest education level: Not on file  Occupational History  . Not on file  Tobacco Use  . Smoking status: Former Games developer  . Smokeless tobacco: Never Used  Substance and Sexual Activity  . Alcohol use: Yes  . Drug use: Not on file  .  Sexual activity: Not on file  Other Topics Concern  . Not on file  Social History Narrative  . Not on file   Social Determinants of Health   Financial Resource Strain:   . Difficulty of Paying Living Expenses:   Food Insecurity:   . Worried About Programme researcher, broadcasting/film/video in the Last Year:   . Barista in the Last Year:   Transportation Needs:   . Freight forwarder (Medical):   Marland Kitchen Lack of Transportation (Non-Medical):   Physical Activity:   . Days of Exercise per Week:   . Minutes of Exercise per Session:   Stress:   . Feeling of Stress :   Social Connections:   . Frequency of Communication with Friends and Family:   . Frequency of Social Gatherings with Friends and Family:   . Attends Religious Services:   . Active  Member of Clubs or Organizations:   . Attends Banker Meetings:   Marland Kitchen Marital Status:     Her Allergies Are:  Allergies  Allergen Reactions  . Erythromycin Nausea And Vomiting  . Iodine Rash  :   Her Current Medications Are:  Outpatient Encounter Medications as of 12/27/2019  Medication Sig  . amLODipine (NORVASC) 5 MG tablet Take 1 tablet (5 mg total) by mouth daily.  . fexofenadine (ALLEGRA) 60 MG tablet Take 60 mg by mouth 2 (two) times daily.  Marland Kitchen FLUoxetine (PROZAC) 20 MG tablet Take 2 tablets (40 mg total) by mouth daily.  Marland Kitchen FLUTICASONE FUROATE IN Inhale into the lungs.  . hydrOXYzine (ATARAX/VISTARIL) 25 MG tablet Take 0.5-1 tablets (12.5-25 mg total) by mouth at bedtime as needed.   No facility-administered encounter medications on file as of 12/27/2019.  :  Review of Systems:  Out of a complete 14 point review of systems, all are reviewed and negative with the exception of these symptoms as listed below: Review of Systems  Neurological:       Here for sleep consult. No prior sleep study. She reports movements during her sleep, some snoring and daytime sleepiness/fatigue.  Epworth Sleepiness Scale 0= would never doze 1= slight chance of dozing 2= moderate chance of dozing 3= high chance of dozing  Sitting and reading:3 Watching TV:2 Sitting inactive in a public place (ex. Theater or meeting):0 As a passenger in a car for an hour without a break:1 Lying down to rest in the afternoon:3 Sitting and talking to someone:0 Sitting quietly after lunch (no alcohol):1 In a car, while stopped in traffic:0 Total:10     Objective:  Neurological Exam  Physical Exam Physical Examination:   Vitals:   12/27/19 0853  BP: 140/87  Pulse: 70    General Examination: The patient is a very pleasant 59 y.o. female in no acute distress. She appears well-developed and well-nourished and well groomed.   HEENT: Normocephalic, atraumatic, pupils are equal, round and  reactive to light, extraocular tracking is good without limitation to gaze excursion or nystagmus noted. Hearing is grossly intact. Face is symmetric with normal facial animation. Speech is clear with no dysarthria noted. There is no hypophonia. There is no lip, neck/head, jaw or voice tremor. Neck is supple with full range of passive and active motion. There are no carotid bruits on auscultation. Oropharynx exam reveals: mild mouth dryness, good dental hygiene and mild airway crowding, due to smaller airway entry, longer uvula noted, tonsils small at 1+ bilaterally.  Mallampati is class II.  Tongue protrudes centrally in palate  elevates symmetrically.  Chest: Clear to auscultation without wheezing, rhonchi or crackles noted.  Heart: S1+S2+0, regular and normal without murmurs, rubs or gallops noted.   Abdomen: Soft, non-tender and non-distended with normal bowel sounds appreciated on auscultation.  Extremities: There is no pitting edema in the distal lower extremities bilaterally.   Skin: Warm and dry without trophic changes noted.   Musculoskeletal: exam reveals no obvious joint deformities, tenderness or joint swelling or erythema.   Neurologically:  Mental status: The patient is awake, alert and oriented in all 4 spheres. Her immediate and remote memory, attention, language skills and fund of knowledge are appropriate. There is no evidence of aphasia, agnosia, apraxia or anomia. Speech is clear with normal prosody and enunciation. Thought process is linear. Mood is normal and affect is normal.  Cranial nerves II - XII are as described above under HEENT exam.  Motor exam: Normal bulk, strength and tone is noted. There is no tremor, Romberg is negative. Reflexes are 2+ throughout, toes are downgoing.  Fine motor skills are well preserved including finger taps, hand movements, rapid alternating patting in the upper extremities bilaterally and normal foot taps are noted bilaterally in the lower  extremities.  Heel-to-shin and finger-to-nose are normal, cerebellar testing otherwise shows no dysmetria or intention tremor.  She has no gait ataxia.  Sensory exam is intact to light touch. Gait, station and balance: She stands easily. No veering to one side is noted. No leaning to one side is noted. Posture is age-appropriate and stance is narrow based. Gait shows normal stride length and normal pace. No problems turning are noted. Tandem walk is unremarkable.                Assessment and Plan:  In summary, Jenny Davidson is a very pleasant 59 y.o.-year old female with an underlying medical history of arthritis, osteopenia, allergies, and anxiety, who presents for evaluation of her involuntary twitching while asleep.  This started after she started Prozac last year in or around October 2020.  She does not have any underlying history of restless leg syndrome and does not endorse any RLS symptoms at this time.  She is not particularly bothered by the twitching and it is primarily noticed by her husband.  She has had occasional muscle twitching in the lower extremities and sometimes in the upper extremities when awake, while sitting relaxed in the evening for example.  She is advised that medications of the SSRI family can often cause involuntary twitching, if this occurs on a repetitive basis at night it is called PLMD, periodic leg movement disorder.  She is advised that just the mere presence of PLM's does not warrant treatment or adjusting or changing the antidepressant medication.  In particular, she has done well mood wise with her Prozac and is reluctant to change medications.  She is advised that sometimes PLM's are caused by iron deficiency or thyroid dysfunction.  TSH was normal last year and she has a physical pending for October this year.  She does not have a history of anemia or iron deficiency.  It may be worth checking her iron status when she has her routine physical next time.   She does not  have any significant snoring or sleep disruption from sleep disordered breathing per her description.  Nevertheless, we can certainly proceed with a sleep study to rule out underlying sleep disordered breathing and to further evaluate for PLM's.  She is not keen on pursuing a sleep study now.  She is advised that we can always order a PSG if she changes her mind.  In addition, if her husband has more input regarding her snoring and breathing pattern and if there is any concern for underlying sleep apnea we can always do a sleep study in the near future.  For now, we mutually agreed to have her monitor her symptoms and she can follow-up as needed.   I answered all her questions today and she was in agreement.  Thank you very much for allowing me to participate in the care of this nice patient. If I can be of any further assistance to you please do not hesitate to call me at [phone].  Sincerely,   Huston Foley, MD, PhD

## 2019-12-27 NOTE — Patient Instructions (Signed)
Your neurological exam is normal.     You may have a condition called periodic leg movement disorder (PLMD). While this is typically associated with symptoms of restless leg syndrome, patients can have periodic leg movements, that is, repetitive twitching in their sleep secondary to medication effects, particularly from taking an SSRI type antidepressant. Sometimes changing the antidepressant regimen can help. If these leg movements disrupt your sleep, it can cause other problems such as daytime sleepiness. Please do not drive if you feel sleepy. Sometimes other conditions can cause leg twitching at night including thyroid dysfunction and iron deficiency.  We can consider a sleep study to further delineate your sleep-related twitching, if needed.  The medication can also cause involuntary twitching when you are sitting, this is called myoclonus.  This is not a sign of any sinister underlying neurological condition and typically these involuntary movements subside if you were to stop taking your antidepressant.  Nevertheless, since you have done well with the Prozac I would not recommend any change in medication at this time and since you really do not have any other symptoms with the twitching such as restless legs syndrome, I would not favor trying to treat your involuntary leg movements with medication for restless leg syndrome.  If you do snore loudly and you have disruption in your breathing pattern while asleep, we can certainly consider doing a sleep study to rule out underlying obstructive sleep apnea.  Patients with sleep apnea can also have twitching in her sleep.  At this juncture, I would be happy to see you back as needed.  If you would like to have a sleep study I can always order this in the near future.

## 2020-01-23 ENCOUNTER — Encounter: Payer: Self-pay | Admitting: Family Medicine

## 2020-03-19 ENCOUNTER — Other Ambulatory Visit: Payer: Self-pay | Admitting: Family Medicine

## 2020-03-19 DIAGNOSIS — F411 Generalized anxiety disorder: Secondary | ICD-10-CM

## 2020-06-08 ENCOUNTER — Other Ambulatory Visit: Payer: Self-pay

## 2020-06-08 ENCOUNTER — Ambulatory Visit (INDEPENDENT_AMBULATORY_CARE_PROVIDER_SITE_OTHER): Payer: 59 | Admitting: Family Medicine

## 2020-06-08 ENCOUNTER — Encounter: Payer: Self-pay | Admitting: Family Medicine

## 2020-06-08 VITALS — BP 137/89 | HR 81 | Temp 98.0°F | Ht 63.0 in | Wt 152.0 lb

## 2020-06-08 DIAGNOSIS — Z1211 Encounter for screening for malignant neoplasm of colon: Secondary | ICD-10-CM | POA: Diagnosis not present

## 2020-06-08 DIAGNOSIS — I10 Essential (primary) hypertension: Secondary | ICD-10-CM

## 2020-06-08 DIAGNOSIS — Z23 Encounter for immunization: Secondary | ICD-10-CM

## 2020-06-08 DIAGNOSIS — Z1329 Encounter for screening for other suspected endocrine disorder: Secondary | ICD-10-CM | POA: Diagnosis not present

## 2020-06-08 DIAGNOSIS — F411 Generalized anxiety disorder: Secondary | ICD-10-CM

## 2020-06-08 DIAGNOSIS — R0789 Other chest pain: Secondary | ICD-10-CM

## 2020-06-08 DIAGNOSIS — Z1159 Encounter for screening for other viral diseases: Secondary | ICD-10-CM

## 2020-06-08 DIAGNOSIS — Z114 Encounter for screening for human immunodeficiency virus [HIV]: Secondary | ICD-10-CM

## 2020-06-08 DIAGNOSIS — E785 Hyperlipidemia, unspecified: Secondary | ICD-10-CM

## 2020-06-08 MED ORDER — FLUOXETINE HCL 20 MG PO TABS: ORAL_TABLET | ORAL | 1 refills | Status: DC

## 2020-06-08 MED ORDER — AMLODIPINE BESYLATE 5 MG PO TABS: 5.0000 mg | ORAL_TABLET | Freq: Every day | ORAL | 1 refills | Status: DC

## 2020-06-08 NOTE — Patient Instructions (Addendum)
We recommend that you schedule a mammogram for breast cancer screening. Typically, you do not need a referral to do this. Please contact a local imaging center to schedule your mammogram.  [facility] - [phone]  *ask for the Radiology Department [facility]) - [phone] or [phone]  [facility] - [phone] [facility] - [phone] [facility] - [phone]  *ask for the Radiology Department [facility] - [phone]  *ask for the Radiology Department [facility] - [phone]  *ask for the Mammography Department [facility] - [phone]  No med changes today. I will check thyroid test and other labs today. If weight continues to increase, follow-up and we can look at other causes. Chest soreness appears to be chest wall pain but please follow-up if that is persistent or worsens. Thanks for coming in today. Return to the clinic or go to the nearest emergency room if any of your symptoms worsen or new symptoms occur.    Managing Your Hypertension Hypertension is commonly called high blood pressure. This is when the force of your blood pressing against the walls of your arteries is too strong. Arteries are blood vessels that carry blood from your heart throughout your body. Hypertension forces the heart to work harder to pump blood, and may cause the arteries to become narrow or stiff. Having untreated or uncontrolled hypertension can cause heart attack, stroke, kidney disease, and other problems. What are blood pressure readings? A blood pressure reading consists of a higher number over a lower number. Ideally, your blood pressure should be below 120/80. The first ("top") number is called the systolic pressure. It is a measure of the pressure in your arteries as your heart beats. The second ("bottom") number is called the diastolic pressure. It is a measure of the  pressure in your arteries as the heart relaxes. What does my blood pressure reading mean? Blood pressure is classified into four stages. Based on your blood pressure reading, your health care provider may use the following stages to determine what type of treatment you need, if any. Systolic pressure and diastolic pressure are measured in a unit called mm Hg. Normal  Systolic pressure: below 120.  Diastolic pressure: below 80. Elevated  Systolic pressure: 120-129.  Diastolic pressure: below 80. Hypertension stage 1  Systolic pressure: 130-139.  Diastolic pressure: 80-89. Hypertension stage 2  Systolic pressure: 140 or above.  Diastolic pressure: 90 or above. What health risks are associated with hypertension? Managing your hypertension is an important responsibility. Uncontrolled hypertension can lead to:  A heart attack.  A stroke.  A weakened blood vessel (aneurysm).  Heart failure.  Kidney damage.  Eye damage.  Metabolic syndrome.  Memory and concentration problems. What changes can I make to manage my hypertension? Hypertension can be managed by making lifestyle changes and possibly by taking medicines. Your health care provider will help you make a plan to bring your blood pressure within a normal range. Eating and drinking   Eat a diet that is high in fiber and potassium, and low in salt (sodium), added sugar, and fat. An example eating plan is called the DASH (Dietary Approaches to Stop Hypertension) diet. To eat this way: ? Eat plenty of fresh fruits and vegetables. Try to fill half of your plate at each meal with fruits and vegetables. ? Eat whole grains, such as whole wheat pasta, brown rice, or whole grain bread. Fill about  one quarter of your plate with whole grains. ? Eat low-fat diary products. ? Avoid fatty cuts of meat, processed or cured meats, and poultry with skin. Fill about one quarter of your plate with lean proteins such as fish, chicken  without skin, beans, eggs, and tofu. ? Avoid premade and processed foods. These tend to be higher in sodium, added sugar, and fat.  Reduce your daily sodium intake. Most people with hypertension should eat less than 1,500 mg of sodium a day.  Limit alcohol intake to no more than 1 drink a day for nonpregnant women and 2 drinks a day for men. One drink equals 12 oz of beer, 5 oz of wine, or 1 oz of hard liquor. Lifestyle  Work with your health care provider to maintain a healthy body weight, or to lose weight. Ask what an ideal weight is for you.  Get at least 30 minutes of exercise that causes your heart to beat faster (aerobic exercise) most days of the week. Activities may include walking, swimming, or biking.  Include exercise to strengthen your muscles (resistance exercise), such as weight lifting, as part of your weekly exercise routine. Try to do these types of exercises for 30 minutes at least 3 days a week.  Do not use any products that contain nicotine or tobacco, such as cigarettes and e-cigarettes. If you need help quitting, ask your health care provider.  Control any long-term (chronic) conditions you have, such as high cholesterol or diabetes. Monitoring  Monitor your blood pressure at home as told by your health care provider. Your personal target blood pressure may vary depending on your medical conditions, your age, and other factors.  Have your blood pressure checked regularly, as often as told by your health care provider. Working with your health care provider  Review all the medicines you take with your health care provider because there may be side effects or interactions.  Talk with your health care provider about your diet, exercise habits, and other lifestyle factors that may be contributing to hypertension.  Visit your health care provider regularly. Your health care provider can help you create and adjust your plan for managing hypertension. Will I need  medicine to control my blood pressure? Your health care provider may prescribe medicine if lifestyle changes are not enough to get your blood pressure under control, and if:  Your systolic blood pressure is 130 or higher.  Your diastolic blood pressure is 80 or higher. Take medicines only as told by your health care provider. Follow the directions carefully. Blood pressure medicines must be taken as prescribed. The medicine does not work as well when you skip doses. Skipping doses also puts you at risk for problems. Contact a health care provider if:  You think you are having a reaction to medicines you have taken.  You have repeated (recurrent) headaches.  You feel dizzy.  You have swelling in your ankles.  You have trouble with your vision. Get help right away if:  You develop a severe headache or confusion.  You have unusual weakness or numbness, or you feel faint.  You have severe pain in your chest or abdomen.  You vomit repeatedly.  You have trouble breathing. Summary  Hypertension is when the force of blood pumping through your arteries is too strong. If this condition is not controlled, it may put you at risk for serious complications.  Your personal target blood pressure may vary depending on your medical conditions, your age, and other factors.  For most people, a normal blood pressure is less than 120/80.  Hypertension is managed by lifestyle changes, medicines, or both. Lifestyle changes include weight loss, eating a healthy, low-sodium diet, exercising more, and limiting alcohol. This information is not intended to replace advice given to you by your health care provider. Make sure you discuss any questions you have with your health care provider. Document Revised: 11/19/2018 Document Reviewed: 06/25/2016 Elsevier Patient Education  [employer].    If you have lab work done today you will be contacted with your lab results within the next 2 weeks.  If you  have not heard from [place] then please contact us. The fastest way to get your results is to register for My Chart.   IF you received an x-ray today, you will receive an invoice from [facility]. Please contact [facility] at [phone] with questions or concerns regarding your invoice.   IF you received labwork today, you will receive an invoice from [facility]. Please contact [facility] at [phone] with questions or concerns regarding your invoice.   Our billing staff will not be able to assist you with questions regarding bills from these companies.  You will be contacted with the lab results as soon as they are available. The fastest way to get your results is to activate your My Chart account. Instructions are located on the last page of this paperwork. If you have not heard from [place] regarding the results in 2 weeks, please contact this office.

## 2020-06-08 NOTE — Progress Notes (Signed)
Subjective:  Patient ID: Jenny Davidson Davidson, female    DOB: [birth year]  Age: 59 y.o. MRN: [id number]  CC:  Chief Complaint  Patient presents with  . Hypertension    Pt reports no issues with this condition since last OV. Pt reports no physical symptoms of this condition.. no other complaints at this time.    [facility] for   Hypertension: Amlodipine 5 mg daily. No new side effects.  Home readings: 130/80.  Muscular soreness in back and chest at times at end of night when lying down.  Improves with massage, heat.  No dyspnea, diaphoresis or sx's with exertion.  BP Readings from Last 3 Encounters:  06/08/20 137/89  12/27/19 140/87  12/08/19 137/87   Lab Results  Component Value Date   CREATININE 0.73 12/08/2019    Hyperlipidemia: Low ASCVD risk for previously, diet/exercise approach. No FH of early CAD.  Exercise: walking 5-10 miles per day and other exercises at home.  The 10-year ASCVD risk score Denman George(Goff DC Montez HagemanJr., et al., 2013) is: 5%   Values used to calculate the score:     Age: [age] years     Sex: Female     Is Non-Hispanic African American: No     Diabetic: No     Tobacco smoker: No     Systolic Blood Pressure: 137 mmHg     Is BP treated: Yes     HDL Cholesterol: 75 mg/dL     Total Cholesterol: 224 mg/dL  Lab Results  Component Value Date   CHOL 224 (H) 12/08/2019   HDL 75 12/08/2019   LDLCALC 138 (H) 12/08/2019   TRIG 66 12/08/2019   CHOLHDL 3.0 12/08/2019   Lab Results  Component Value Date   ALT 14 12/08/2019   AST 20 12/08/2019   ALKPHOS 112 12/08/2019   BILITOT 0.4 12/08/2019   Generalized anxiety disorder With nocturnal leg movements discussed in April.  Doing well at that time on 40 mg fluoxetine, rare use of hydroxyzine.  Still with nocturnal leg movements.  Referred to sleep specialist for possible PLMD/sleep testing.  Evaluated May 18, possible PLMD.  Possibly related to her SSRI, mild clonus. Mood is stable on fluoxetine.  Movements  same - not bothersome.  Mom passed in June. initially difficult - doing better.  Weight has increased.  Wt Readings from Last 3 Encounters:  06/08/20 152 lb (68.9 kg)  12/27/19 146 lb 5 oz (66.4 kg)  12/08/19 147 lb (66.7 kg)   [facility] 06/08/2020 12/08/2019 11/17/2019 07/18/2019 06/06/2019  Decreased Interest 0 0 0 1 0  Down, Depressed, Hopeless 0 0 1 1 0  PHQ - 2 Score 0 0 1 2 0  Altered sleeping - - - 2 -  Tired, decreased energy - - - 2 -  Change in appetite - - - 1 -  Feeling bad or failure about yourself  - - - 1 -  Trouble concentrating - - - 0 -  Moving slowly or fidgety/restless - - - 1 -  Suicidal thoughts - - - 0 -  PHQ-9 Score - - - 9 -    Health maintenance: Flu vaccine given today. Colon cancer screening: Screening options with colonoscopy versus Cologuard discussed. Discussed timing of repeat testing intervals if normal, as well as potential need for diagnostic Colonoscopy if positive Cologuard. Understanding expressed, and chose Cologuard.  Mammogram, ordered in April. Needs to reschedule, at time of her mom's passing.   HIV/hep C: agrees  to testing.   History Patient Active Problem List   Diagnosis Date Noted  . Seasonal allergies 12/09/2013  . Osteopenia 12/09/2013   Past Medical History:  Diagnosis Date  . Allergy   . Arthritis   . Osteoporosis    Past Surgical History:  Procedure Laterality Date  . ABDOMINAL HYSTERECTOMY    . BREAST SURGERY    . TUBAL LIGATION    . tumore removed  2008   Allergies  Allergen Reactions  . Erythromycin Nausea And Vomiting  . Iodine Rash   Prior to Admission medications   Medication Sig Start Date End Date Taking? Authorizing Provider  amLODipine (NORVASC) 5 MG tablet Take 1 tablet (5 mg total) by mouth daily. 11/17/19   Shade Flood, MD  fexofenadine (ALLEGRA) 60 MG tablet Take 60 mg by mouth 2 (two) times daily.    [provider]  FLUoxetine (PROZAC) 20 MG tablet TAKE 2 TABLETS(40 MG)  BY MOUTH DAILY 03/19/20   Shade Flood, MD  FLUTICASONE FUROATE IN Inhale into the lungs.    [provider]  hydrOXYzine (ATARAX/VISTARIL) 25 MG tablet Take 0.5-1 tablets (12.5-25 mg total) by mouth at bedtime as needed. 11/17/19   Shade Flood, MD   Social History   Socioeconomic History  . Marital status: Married    Spouse name: Not on file  . Number of children: Not on file  . Years of education: Not on file  . Highest education level: Not on file  Occupational History  . Not on file  Tobacco Use  . Smoking status: Former Games developer  . Smokeless tobacco: Never Used  Substance and Sexual Activity  . Alcohol use: Yes  . Drug use: Not on file  . Sexual activity: Not on file  Other Topics Concern  . Not on file  Social History Narrative  . Not on file   Social Determinants of Health   Financial Resource Strain:   . Difficulty of Paying Living Expenses: Not on file  Food Insecurity:   . Worried About Programme researcher, broadcasting/film/video in the Last Year: Not on file  . Ran Out of Food in the Last Year: Not on file  Transportation Needs:   . Lack of Transportation (Medical): Not on file  . Lack of Transportation (Non-Medical): Not on file  Physical Activity:   . Days of Exercise per Week: Not on file  . Minutes of Exercise per Session: Not on file  Stress:   . Feeling of Stress : Not on file  Social Connections:   . Frequency of Communication with Friends and Family: Not on file  . Frequency of Social Gatherings with Friends and Family: Not on file  . Attends Religious Services: Not on file  . Active Member of Clubs or Organizations: Not on file  . Attends Banker Meetings: Not on file  . Marital Status: Not on file  Intimate Partner Violence:   . Fear of Current or Ex-Partner: Not on file  . Emotionally Abused: Not on file  . Physically Abused: Not on file  . Sexually Abused: Not on file    Review of Systems  Constitutional: Negative for fatigue.    Respiratory: Negative for chest tightness and shortness of breath.   Cardiovascular: Negative for chest pain (chest wall, none currently. ), palpitations and leg swelling.  Gastrointestinal: Negative for abdominal pain and blood in stool.  Neurological: Negative for dizziness, syncope, light-headedness and headaches.     Objective:  Vitals:   06/08/20 0805  BP: 137/89  Pulse: 81  Temp: 98 F (36.7 C)  TempSrc: Temporal  SpO2: 98%  Weight: 152 lb (68.9 kg)  Height: 5\' 3"  (1.6 m)     Physical Exam Vitals reviewed.  Constitutional:      Appearance: She is well-developed.  HENT:     Head: Normocephalic and atraumatic.  Eyes:     Conjunctiva/sclera: Conjunctivae normal.     Pupils: Pupils are equal, round, and reactive to light.  Neck:     Vascular: No carotid bruit.  Cardiovascular:     Rate and Rhythm: Normal rate and regular rhythm.     Heart sounds: Normal heart sounds.  Pulmonary:     Effort: Pulmonary effort is normal.     Breath sounds: Normal breath sounds.  Abdominal:     Palpations: Abdomen is soft. There is no pulsatile mass.     Tenderness: There is no abdominal tenderness.  Skin:    General: Skin is warm and dry.  Neurological:     Mental Status: She is alert and oriented to person, place, and time.  Psychiatric:        Behavior: Behavior normal.        Assessment & Plan:  Synai Prettyman is a 59 y.o. female . Essential hypertension - Plan: Lipid panel, Comprehensive metabolic panel, TSH  -Borderline but overall stable. Continue same regimen. Check labs  Need for vaccination - Plan: Flu Vaccine QUAD 36+ mos IM  Special screening for malignant neoplasms, colon - Plan: Cologuard  Screening for thyroid disorder - Plan: TSH  -Check TSH with weight gain, follow-up if continued weight gain. Unlikely medication related.  Chest wall pain  -By history. No red flags on symptoms, RTC precautions if persistent or worsening. Stretches, range of motion  throughout the day initially.  Screening for HIV (human immunodeficiency virus) - Plan: HIV Antibody (routine testing w rflx)   Encounter for hepatitis C screening test for low risk patient - Plan: Hepatitis C antibody  Hyperlipidemia, unspecified hyperlipidemia type - Plan: Lipid panel  -Mild elevation previously, low ASCVD risk score, repeat testing.  No orders of the defined types were placed in this encounter.  Patient Instructions   We recommend that you schedule a mammogram for breast cancer screening. Typically, you do not need a referral to do this. Please contact a local imaging center to schedule your mammogram.  [facility] - [phone]  *ask for the Radiology Department [facility]) - [phone] or [phone]  [facility] - [phone] [facility] - [phone] [facility] - [phone]  *ask for the Radiology Department [facility] - [phone]  *ask for the Radiology Department [facility] - [phone]  *ask for the Mammography Department [facility] - [phone]  No med changes today. I will check thyroid test and other labs today. If weight continues to increase, follow-up and we can look at other causes. Chest soreness appears to be chest wall pain but please follow-up if that is persistent or worsens. Thanks for coming in today. Return to the clinic or go to the nearest emergency room if any of your symptoms worsen or new symptoms occur.    Managing Your Hypertension Hypertension is commonly called high blood pressure. This is when the force of your blood pressing against the walls of your arteries is too strong. Arteries are blood vessels that carry blood from  your heart throughout your body. Hypertension forces the heart to work harder to pump blood, and may cause the arteries to become narrow or stiff. Having untreated or uncontrolled  hypertension can cause heart attack, stroke, kidney disease, and other problems. What are blood pressure readings? A blood pressure reading consists of a higher number over a lower number. Ideally, your blood pressure should be below 120/80. The first ("top") number is called the systolic pressure. It is a measure of the pressure in your arteries as your heart beats. The second ("bottom") number is called the diastolic pressure. It is a measure of the pressure in your arteries as the heart relaxes. What does my blood pressure reading mean? Blood pressure is classified into four stages. Based on your blood pressure reading, your health care provider may use the following stages to determine what type of treatment you need, if any. Systolic pressure and diastolic pressure are measured in a unit called mm Hg. Normal  Systolic pressure: below 120.  Diastolic pressure: below 80. Elevated  Systolic pressure: 120-129.  Diastolic pressure: below 80. Hypertension stage 1  Systolic pressure: 130-139.  Diastolic pressure: 80-89. Hypertension stage 2  Systolic pressure: 140 or above.  Diastolic pressure: 90 or above. What health risks are associated with hypertension? Managing your hypertension is an important responsibility. Uncontrolled hypertension can lead to:  A heart attack.  A stroke.  A weakened blood vessel (aneurysm).  Heart failure.  Kidney damage.  Eye damage.  Metabolic syndrome.  Memory and concentration problems. What changes can I make to manage my hypertension? Hypertension can be managed by making lifestyle changes and possibly by taking medicines. Your health care provider will help you make a plan to bring your blood pressure within a normal range. Eating and drinking   Eat a diet that is high in fiber and potassium, and low in salt (sodium), added sugar, and fat. An example eating plan is called the DASH (Dietary Approaches to Stop Hypertension) diet. To eat  this way: ? Eat plenty of fresh fruits and vegetables. Try to fill half of your plate at each meal with fruits and vegetables. ? Eat whole grains, such as whole wheat pasta, brown rice, or whole grain bread. Fill about one quarter of your plate with whole grains. ? Eat low-fat diary products. ? Avoid fatty cuts of meat, processed or cured meats, and poultry with skin. Fill about one quarter of your plate with lean proteins such as fish, chicken without skin, beans, eggs, and tofu. ? Avoid premade and processed foods. These tend to be higher in sodium, added sugar, and fat.  Reduce your daily sodium intake. Most people with hypertension should eat less than 1,500 mg of sodium a day.  Limit alcohol intake to no more than 1 drink a day for nonpregnant women and 2 drinks a day for men. One drink equals 12 oz of beer, 5 oz of wine, or 1 oz of hard liquor. Lifestyle  Work with your health care provider to maintain a healthy body weight, or to lose weight. Ask what an ideal weight is for you.  Get at least 30 minutes of exercise that causes your heart to beat faster (aerobic exercise) most days of the week. Activities may include walking, swimming, or biking.  Include exercise to strengthen your muscles (resistance exercise), such as weight lifting, as part of your weekly exercise routine. Try to do these types of exercises for 30 minutes at least 3 days a week.  Do  not use any products that contain nicotine or tobacco, such as cigarettes and e-cigarettes. If you need help quitting, ask your health care provider.  Control any long-term (chronic) conditions you have, such as high cholesterol or diabetes. Monitoring  Monitor your blood pressure at home as told by your health care provider. Your personal target blood pressure may vary depending on your medical conditions, your age, and other factors.  Have your blood pressure checked regularly, as often as told by your health care provider. Working  with your health care provider  Review all the medicines you take with your health care provider because there may be side effects or interactions.  Talk with your health care provider about your diet, exercise habits, and other lifestyle factors that may be contributing to hypertension.  Visit your health care provider regularly. Your health care provider can help you create and adjust your plan for managing hypertension. Will I need medicine to control my blood pressure? Your health care provider may prescribe medicine if lifestyle changes are not enough to get your blood pressure under control, and if:  Your systolic blood pressure is 130 or higher.  Your diastolic blood pressure is 80 or higher. Take medicines only as told by your health care provider. Follow the directions carefully. Blood pressure medicines must be taken as prescribed. The medicine does not work as well when you skip doses. Skipping doses also puts you at risk for problems. Contact a health care provider if:  You think you are having a reaction to medicines you have taken.  You have repeated (recurrent) headaches.  You feel dizzy.  You have swelling in your ankles.  You have trouble with your vision. Get help right away if:  You develop a severe headache or confusion.  You have unusual weakness or numbness, or you feel faint.  You have severe pain in your chest or abdomen.  You vomit repeatedly.  You have trouble breathing. Summary  Hypertension is when the force of blood pumping through your arteries is too strong. If this condition is not controlled, it may put you at risk for serious complications.  Your personal target blood pressure may vary depending on your medical conditions, your age, and other factors. For most people, a normal blood pressure is less than 120/80.  Hypertension is managed by lifestyle changes, medicines, or both. Lifestyle changes include weight loss, eating a healthy,  low-sodium diet, exercising more, and limiting alcohol. This information is not intended to replace advice given to you by your health care provider. Make sure you discuss any questions you have with your health care provider. Document Revised: 11/19/2018 Document Reviewed: 06/25/2016 Elsevier Patient Education  [employer].    If you have lab work done today you will be contacted with your lab results within the next 2 weeks.  If you have not heard from [place] then please contact us. The fastest way to get your results is to register for My Chart.   IF you received an x-ray today, you will receive an invoice from [facility]. Please contact [facility] at [phone] with questions or concerns regarding your invoice.   IF you received labwork today, you will receive an invoice from [place]. Please contact [facility] at [phone] with questions or concerns regarding your invoice.   Our billing staff will not be able to assist you with questions regarding bills from these companies.  You will be contacted with the lab results as soon as they are available. The  fastest way to get your results is to activate your My Chart account. Instructions are located on the last page of this paperwork. If you have not heard from [place] regarding the results in 2 weeks, please contact this office.         Signed, Merri Ray, MD [facility]

## 2020-06-09 LAB — LIPID PANEL
Chol/HDL Ratio: 3.6 ratio (ref 0.0–4.4)
Cholesterol, Total: 240 mg/dL — ABNORMAL HIGH (ref 100–199)
HDL: 66 mg/dL (ref >39)
LDL Chol Calc (NIH): 153 mg/dL — ABNORMAL HIGH (ref 0–99)
Triglycerides: 117 mg/dL (ref 0–149)
VLDL Cholesterol Cal: 21 mg/dL (ref 5–40)

## 2020-06-09 LAB — TSH: TSH: 1.94 u[IU]/mL (ref 0.450–4.500)

## 2020-06-09 LAB — HIV ANTIBODY (ROUTINE TESTING W REFLEX): HIV Screen 4th Generation wRfx: NONREACTIVE

## 2020-06-09 LAB — COMPREHENSIVE METABOLIC PANEL
ALT: 12 IU/L (ref 0–32)
AST: 18 IU/L (ref 0–40)
Albumin/Globulin Ratio: 2 (ref 1.2–2.2)
Albumin: 4.4 g/dL (ref 3.8–4.8)
Alkaline Phosphatase: 106 IU/L (ref 44–121)
BUN/Creatinine Ratio: 12 (ref 12–28)
BUN: 9 mg/dL (ref 8–27)
Bilirubin Total: 0.3 mg/dL (ref 0.0–1.2)
CO2: 22 mmol/L (ref 20–29)
Calcium: 8.9 mg/dL (ref 8.7–10.3)
Chloride: 104 mmol/L (ref 96–106)
Creatinine, Ser: 0.74 mg/dL (ref 0.57–1.00)
GFR calc Af Amer: 101 mL/min/{1.73_m2} (ref >59)
GFR calc non Af Amer: 88 mL/min/{1.73_m2} (ref >59)
Globulin, Total: 2.2 g/dL (ref 1.5–4.5)
Glucose: 93 mg/dL (ref 65–99)
Potassium: 4.4 mmol/L (ref 3.5–5.2)
Sodium: 140 mmol/L (ref 134–144)
Total Protein: 6.6 g/dL (ref 6.0–8.5)

## 2020-06-09 LAB — HEPATITIS C ANTIBODY: Hep C Virus Ab: <0.1 s/co ratio (ref 0.0–0.9)

## 2020-06-13 ENCOUNTER — Encounter: Payer: Self-pay | Admitting: Radiology

## 2020-10-29 ENCOUNTER — Other Ambulatory Visit: Payer: Self-pay | Admitting: Family Medicine

## 2020-10-29 DIAGNOSIS — I10 Essential (primary) hypertension: Secondary | ICD-10-CM

## 2020-12-07 ENCOUNTER — Ambulatory Visit: Payer: Self-pay | Admitting: Family Medicine

## 2020-12-08 ENCOUNTER — Other Ambulatory Visit: Payer: Self-pay | Admitting: Family Medicine

## 2020-12-08 DIAGNOSIS — F411 Generalized anxiety disorder: Secondary | ICD-10-CM

## 2020-12-19 ENCOUNTER — Telehealth (INDEPENDENT_AMBULATORY_CARE_PROVIDER_SITE_OTHER): Payer: Self-pay | Admitting: Family Medicine

## 2020-12-19 DIAGNOSIS — E785 Hyperlipidemia, unspecified: Secondary | ICD-10-CM

## 2020-12-19 DIAGNOSIS — F411 Generalized anxiety disorder: Secondary | ICD-10-CM

## 2020-12-19 DIAGNOSIS — I10 Essential (primary) hypertension: Secondary | ICD-10-CM

## 2020-12-19 DIAGNOSIS — F5104 Psychophysiologic insomnia: Secondary | ICD-10-CM

## 2020-12-19 DIAGNOSIS — G4761 Periodic limb movement disorder: Secondary | ICD-10-CM

## 2020-12-19 MED ORDER — HYDROXYZINE HCL 25 MG PO TABS: 12.5000 mg | ORAL_TABLET | Freq: Every evening | ORAL | 1 refills | Status: DC | PRN

## 2020-12-19 MED ORDER — FLUOXETINE HCL 20 MG PO TABS: 40.0000 mg | ORAL_TABLET | Freq: Every day | ORAL | 1 refills | Status: DC

## 2020-12-19 MED ORDER — AMLODIPINE BESYLATE 5 MG PO TABS: ORAL_TABLET | ORAL | 1 refills | Status: DC

## 2020-12-19 NOTE — Progress Notes (Signed)
Virtual Visit via Video Note  I connected with [facility] on 12/19/20 at 5:37 PM by a video enabled telemedicine application and verified that I am speaking with the correct person using two identifiers.  Patient location: home My location: office [facility].    I discussed the limitations, risks, security and privacy concerns of performing an evaluation and management service by telephone and the availability of in person appointments. I also discussed with the patient that there may be a patient responsible charge related to this service. The patient expressed understanding and agreed to proceed, consent obtained  Chief complaint:  Chief Complaint  Patient presents with  . Hypertension    Pt reports doing well, denies physical symptoms, pt reports BP 113/81, 116/79, 120/76 over last few days   . Anxiety    Pt reports sxs are pretty well controlled, expresses a desire to come off medication but feels is this is not possible while working. Pt reports this works fairly well.      History of Present Illness: Jenny Davidson is a 60 y.o. female  Hypertension: Amlodipine 5mg  qd.  Home readings:controlled as above.no new side effects.  Constitutional: Negative for fatigue and unexpected weight change.  Eyes: Negative for visual disturbance.  Respiratory: Negative for cough, chest tightness and shortness of breath.   Cardiovascular: Negative for chest pain, palpitations and leg swelling.  Gastrointestinal: Negative for abdominal pain and blood in stool.  Neurological: Negative for dizziness, light-headedness and headaches.     BP Readings from Last 3 Encounters:  06/08/20 137/89  12/27/19 140/87  12/08/19 137/87   Lab Results  Component Value Date   CREATININE 0.74 06/08/2020    Hyperlipidemia: No current meds.  No changes in diet exercise. waking 5-52miles per day.  The 10-year ASCVD risk score 9m DC Denman George., et al., 2013) is: 6.3%   Values used to calculate the  score:     Age: 50 years     Sex: Female     Is Non-Hispanic African American: No     Diabetic: No     Tobacco smoker: No     Systolic Blood Pressure: 137 mmHg     Is BP treated: Yes     HDL Cholesterol: 66 mg/dL     Total Cholesterol: 240 mg/dL  Lab Results  Component Value Date   CHOL 240 (H) 06/08/2020   HDL 66 06/08/2020   LDLCALC 153 (H) 06/08/2020   TRIG 117 06/08/2020   CHOLHDL 3.6 06/08/2020   Lab Results  Component Value Date   ALT 12 06/08/2020   AST 18 06/08/2020   ALKPHOS 106 06/08/2020   BILITOT 0.3 06/08/2020    General Anxiety Disorder: prozac 40mg  qd. Managing anxiety. Stressors at work - plans to continue while still working.  No new side effects.  Sleeping ok. 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  No recent ferritin. No MVI or iron supplement at this time.  Hydroxyzine on ly used intermittently - works when taken.   Depression screen [facility] 2/9 06/08/2020 12/08/2019 11/17/2019 07/18/2019 06/06/2019  Decreased Interest 0 0 0 1 0  Down, Depressed, Hopeless 0 0 1 1 0  PHQ - 2 Score 0 0 1 2 0  Altered sleeping - - - 2 -  Tired, decreased energy - - - 2 -  Change in appetite - - - 1 -  Feeling bad or failure about yourself  - - - 1 -  Trouble concentrating - - - 0 -  Moving slowly or fidgety/restless - - - 1 -  Suicidal thoughts - - - 0 -  PHQ-9 Score - - - 9 -   HM: Discussed mammogram - number provided.  Colon CA screening: has Cologuard - will place in bathroom to remind to complete.   Patient Active Problem List   Diagnosis Date Noted  . Seasonal allergies 12/09/2013  . Osteopenia 12/09/2013   Past Medical History:  Diagnosis Date  . Allergy   . Arthritis   . Osteoporosis    Past Surgical History:  Procedure Laterality Date  . ABDOMINAL HYSTERECTOMY    . BREAST SURGERY    . TUBAL LIGATION    . tumore removed  2008   Allergies  Allergen Reactions  . Erythromycin Nausea And Vomiting  . Iodine Rash   Prior to Admission  medications   Medication Sig Start Date End Date Taking? Authorizing Provider  amLODipine (NORVASC) 5 MG tablet TAKE 1 TABLET(5 MG) BY MOUTH DAILY 10/29/20   Shade Flood, MD  fexofenadine (ALLEGRA) 60 MG tablet Take 60 mg by mouth 2 (two) times daily.    [provider]  FLUoxetine (PROZAC) 20 MG tablet TAKE 2 TABLETS(40 MG) BY MOUTH DAILY 12/10/20   Shade Flood, MD  FLUTICASONE FUROATE IN Inhale into the lungs.    [provider]  hydrOXYzine (ATARAX/VISTARIL) 25 MG tablet Take 0.5-1 tablets (12.5-25 mg total) by mouth at bedtime as needed. 11/17/19   Shade Flood, MD   Social History   Socioeconomic History  . Marital status: Married    Spouse name: Not on file  . Number of children: Not on file  . Years of education: Not on file  . Highest education level: Not on file  Occupational History  . Not on file  Tobacco Use  . Smoking status: Former Games developer  . Smokeless tobacco: Never Used  Substance and Sexual Activity  . Alcohol use: Yes  . Drug use: Not on file  . Sexual activity: Not on file  Other Topics Concern  . Not on file  Social History Narrative  . Not on file   Social Determinants of Health   Financial Resource Strain: Not on file  Food Insecurity: Not on file  Transportation Needs: Not on file  Physical Activity: Not on file  Stress: Not on file  Social Connections: Not on file  Intimate Partner Violence: Not on file    Observations/Objective: There were no vitals filed for this visit. Recent home blood pressure readings as above, no current vitals for today's visit.  Nontoxic appearance on video, euthymic mood, no respiratory distress.  Speaking in full sentences.  Coherent responses.  All questions were answered.  Understanding of plan expressed.  Assessment and Plan: PLMD (periodic limb movement disorder) - Plan: Ferritin  -Persistent symptoms, check ferritin, consider iron supplementation if low normal or  low.  Psychophysiological insomnia - Plan: hydrOXYzine (ATARAX/VISTARIL) 25 MG tablet  -Stable with intermittent hydroxyzine.  Continue same  Generalized anxiety disorder - Plan: FLUoxetine (PROZAC) 20 MG tablet  -Overall stable, situational stress/work stress component.  Will remain on same dose fluoxetine for now, option of lower dose if well controlled in the future or if job change could consider lower dose versus trial off medication.  Essential hypertension - Plan: amLODipine (NORVASC) 5 MG tablet  -Stable by home readings.  Check labs.  Continue same dose amlodipine for now.  Hyperlipidemia, unspecified hyperlipidemia type - Plan: Comprehensive metabolic panel, Lipid panel  -Relatively  low ASCVD risk score, continue to watch diet, exercise, check labs with lab only visit.  Follow Up Instructions: Lab visit next 1 to 2 weeks, 65-month follow-up.   I discussed the assessment and treatment plan with the patient. The patient was provided an opportunity to ask questions and all were answered. The patient agreed with the plan and demonstrated an understanding of the instructions.   The patient was advised to call back or seek an in-person evaluation if the symptoms worsen or if the condition fails to improve as anticipated.  I provided 15 minutes of non-face-to-face time during this encounter.   Shade Flood, MD

## 2021-04-21 ENCOUNTER — Other Ambulatory Visit: Payer: Self-pay | Admitting: Family Medicine

## 2021-04-21 DIAGNOSIS — I10 Essential (primary) hypertension: Secondary | ICD-10-CM

## 2021-05-30 ENCOUNTER — Other Ambulatory Visit: Payer: Self-pay | Admitting: Family Medicine

## 2021-05-30 ENCOUNTER — Other Ambulatory Visit: Payer: Self-pay | Admitting: Critical Care Medicine

## 2021-05-30 DIAGNOSIS — Z1231 Encounter for screening mammogram for malignant neoplasm of breast: Secondary | ICD-10-CM

## 2021-06-12 ENCOUNTER — Telehealth (INDEPENDENT_AMBULATORY_CARE_PROVIDER_SITE_OTHER): Payer: Self-pay | Admitting: Family Medicine

## 2021-06-12 ENCOUNTER — Encounter: Payer: Self-pay | Admitting: Family Medicine

## 2021-06-12 ENCOUNTER — Emergency Department (HOSPITAL_BASED_OUTPATIENT_CLINIC_OR_DEPARTMENT_OTHER)
Admission: EM | Admit: 2021-06-12 | Discharge: 2021-06-13 | Disposition: A | Discharge status: Home / Self Care | Payer: 59 | Attending: Emergency Medicine | Admitting: Emergency Medicine

## 2021-06-12 ENCOUNTER — Emergency Department (HOSPITAL_BASED_OUTPATIENT_CLINIC_OR_DEPARTMENT_OTHER): Payer: 59 | Admitting: Radiology

## 2021-06-12 ENCOUNTER — Other Ambulatory Visit: Payer: Self-pay

## 2021-06-12 ENCOUNTER — Encounter (HOSPITAL_BASED_OUTPATIENT_CLINIC_OR_DEPARTMENT_OTHER): Payer: Self-pay

## 2021-06-12 VITALS — Temp 101.0°F

## 2021-06-12 DIAGNOSIS — R0789 Other chest pain: Secondary | ICD-10-CM

## 2021-06-12 DIAGNOSIS — Z87891 Personal history of nicotine dependence: Secondary | ICD-10-CM | POA: Diagnosis not present

## 2021-06-12 DIAGNOSIS — B349 Viral infection, unspecified: Secondary | ICD-10-CM | POA: Insufficient documentation

## 2021-06-12 DIAGNOSIS — Z8249 Family history of ischemic heart disease and other diseases of the circulatory system: Secondary | ICD-10-CM

## 2021-06-12 DIAGNOSIS — Z20822 Contact with and (suspected) exposure to covid-19: Secondary | ICD-10-CM | POA: Diagnosis not present

## 2021-06-12 DIAGNOSIS — R509 Fever, unspecified: Secondary | ICD-10-CM

## 2021-06-12 DIAGNOSIS — M7918 Myalgia, other site: Secondary | ICD-10-CM | POA: Diagnosis present

## 2021-06-12 DIAGNOSIS — M791 Myalgia, unspecified site: Secondary | ICD-10-CM

## 2021-06-12 LAB — RESP PANEL BY RT-PCR (FLU A&B, COVID) ARPGX2
Influenza A by PCR: NEGATIVE
Influenza B by PCR: NEGATIVE
SARS Coronavirus 2 by RT PCR: NEGATIVE

## 2021-06-12 LAB — GROUP A STREP BY PCR: Group A Strep by PCR: NOT DETECTED

## 2021-06-12 NOTE — ED Triage Notes (Signed)
Patient called Once to Triage with no Response.

## 2021-06-12 NOTE — Progress Notes (Signed)
Virtual Visit via Video Note  I connected with [facility] on 06/12/21 at 4:57 PM by a video enabled telemedicine application and verified that I am speaking with the correct person using two identifiers.  Patient location: home - by self.  My location: office - [place]   I discussed the limitations, risks, security and privacy concerns of performing an evaluation and management service by telephone and the availability of in person appointments. I also discussed with the patient that there may be a patient responsible charge related to this service. The patient expressed understanding and agreed to proceed, consent obtained  Chief complaint:  Chief Complaint  Patient presents with   Fever    Pt reports 100-102 since yesterday, body aches, no appetite, 2 negative COVID tests, chest pressure without SOB, pt reports started on Monday, fatigue      History of Present Illness: Jenny Davidson is a 60 y.o. female  Influenza like illness, chest discomfort.  Started 2 nights ago with diffuse muscle aches, HA, body aches, fatigue. Still able to work, fever last night 102. Min cough - few times per day.  No abd pain, decreased appetite. Nausea last night.  Minimal increased urination, but no hematuria or dysuria. Drinking more fluids as likely cause. No vomiting.  Feels something heavy sitting in chest - past 2 days. About same past 2 days better at rest. Worse when up and active, but body aches worse with activity as well.  No dyspnea.  No confusion/disorientation.  Drinking fluids.  FH of CAD in late 2022/11/21, deceased at 62yo. Personal hx of HTN.  Tx: none.  Covid test last night and this am negative.  No known sick contacts, works at [employer].  Temp 100 this afternoon.   Has not received flu vaccine yet this year.  Covid vaccine - primary series, 2 boosters, most recent in Nov 21, 2022.    Patient Active Problem List   Diagnosis Date Noted   Seasonal allergies 12/09/2013   Osteopenia  12/09/2013   Past Medical History:  Diagnosis Date   Allergy    Arthritis    Osteoporosis    Past Surgical History:  Procedure Laterality Date   ABDOMINAL HYSTERECTOMY     BREAST SURGERY     TUBAL LIGATION     tumore removed  2006/11/21   Allergies  Allergen Reactions   Erythromycin Nausea And Vomiting   Iodine Rash   Prior to Admission medications   Medication Sig Start Date End Date Taking? Authorizing Provider  amLODipine (NORVASC) 5 MG tablet TAKE 1 TABLET(5 MG) BY MOUTH DAILY 04/22/21  Yes Wendie Agreste, MD  fexofenadine (ALLEGRA) 60 MG tablet Take 60 mg by mouth 2 (two) times daily.   Yes [provider]  FLUoxetine (PROZAC) 20 MG tablet Take 2 tablets (40 mg total) by mouth daily. 12/19/20  Yes Wendie Agreste, MD  FLUTICASONE FUROATE IN Inhale into the lungs.   Yes [provider]  hydrOXYzine (ATARAX/VISTARIL) 25 MG tablet Take 0.5-1 tablets (12.5-25 mg total) by mouth at bedtime as needed. 12/19/20  Yes Wendie Agreste, MD   Social History   Socioeconomic History   Marital status: Married    Spouse name: Not on file   Number of children: Not on file   Years of education: Not on file   Highest education level: Not on file  Occupational History   Not on file  Tobacco Use   Smoking status: Former   Smokeless tobacco: Never  Substance and  Sexual Activity   Alcohol use: Yes   Drug use: Not on file   Sexual activity: Not on file  Other Topics Concern   Not on file  Social History Narrative   Not on file   Social Determinants of Health   Financial Resource Strain: Not on file  Food Insecurity: Not on file  Transportation Needs: Not on file  Physical Activity: Not on file  Stress: Not on file  Social Connections: Not on file  Intimate Partner Violence: Not on file    Observations/Objective: Vitals:   06/12/21 1600  Temp: (!) 101 F (38.3 C)  Appears fatigued but nontoxic, speaking in full sentences without apparent respiratory  distress.  Coherent responses.  No audible wheeze/stridor.  No cough during exam.   Assessment and Plan: Chest heaviness  Family history of early CAD  Myalgia  Fever, unspecified fever cause  Diffuse body aches, headache starting 2 nights ago.  Proceeded to fever yesterday, persistent fever today slightly lower.  Minimal cough, no other respiratory symptoms at this time.  She does report chest heaviness as above with feeling as if something is sitting on her chest.  Does report the symptoms worsen with activity, improved with rest.  Personal history of hypertension, family history of early CAD.  Suspected viral syndrome either COVID with false negative initial testing versus influenza versus RSV versus other influenza-like illness.  Concerning chest symptoms as above given her family history.  Recommended ER visit for further evaluation and possible testing, labs, imaging.  Understanding expressed.  Plans to go tonight.  Follow Up Instructions: ER evaluation tonight.   I discussed the assessment and treatment plan with the patient. The patient was provided an opportunity to ask questions and all were answered. The patient agreed with the plan and demonstrated an understanding of the instructions.   The patient was advised to call back or seek an in-person evaluation if the symptoms worsen or if the condition fails to improve as anticipated.  Shade Flood, MD

## 2021-06-12 NOTE — ED Triage Notes (Signed)
Patient here POV from Home with Generalized Body Aches, Headache, Subjective Fevers since 2 days PTA.  Patient states has not been in [facility] with Sick Persons. Highest Temp: 102 last PM. PO Ibuprofen moderately effective in treating Symptoms.  NAD Noted during Triage. A&Ox4. GCS 15. Ambulatory.

## 2021-06-13 ENCOUNTER — Telehealth: Payer: Self-pay

## 2021-06-13 LAB — BASIC METABOLIC PANEL
Anion gap: 12 (ref 5–15)
BUN: 11 mg/dL (ref 8–23)
CO2: 23 mmol/L (ref 22–32)
Calcium: 9 mg/dL (ref 8.9–10.3)
Chloride: 104 mmol/L (ref 98–111)
Creatinine, Ser: 0.79 mg/dL (ref 0.44–1.00)
GFR, Estimated: >60 mL/min (ref >60)
Glucose, Bld: 103 mg/dL — ABNORMAL HIGH (ref 70–99)
Potassium: 3.7 mmol/L (ref 3.5–5.1)
Sodium: 139 mmol/L (ref 135–145)

## 2021-06-13 LAB — CBC WITH DIFFERENTIAL/PLATELET
Abs Immature Granulocytes: 0.01 10*3/uL (ref 0.00–0.07)
Basophils Absolute: 0 10*3/uL (ref 0.0–0.1)
Basophils Relative: 1 %
Eosinophils Absolute: 0.3 10*3/uL (ref 0.0–0.5)
Eosinophils Relative: 6 %
HCT: 46.1 % — ABNORMAL HIGH (ref 36.0–46.0)
Hemoglobin: 15.1 g/dL — ABNORMAL HIGH (ref 12.0–15.0)
Immature Granulocytes: 0 %
Lymphocytes Relative: 29 %
Lymphs Abs: 1.3 10*3/uL (ref 0.7–4.0)
MCH: 27 pg (ref 26.0–34.0)
MCHC: 32.8 g/dL (ref 30.0–36.0)
MCV: 82.5 fL (ref 80.0–100.0)
Monocytes Absolute: 0.4 10*3/uL (ref 0.1–1.0)
Monocytes Relative: 9 %
Neutro Abs: 2.5 10*3/uL (ref 1.7–7.7)
Neutrophils Relative %: 55 %
Platelets: 227 10*3/uL (ref 150–400)
RBC: 5.59 MIL/uL — ABNORMAL HIGH (ref 3.87–5.11)
RDW: 13.5 % (ref 11.5–15.5)
WBC: 4.5 10*3/uL (ref 4.0–10.5)
nRBC: 0 % (ref 0.0–0.2)

## 2021-06-13 LAB — URINALYSIS, ROUTINE W REFLEX MICROSCOPIC
Bilirubin Urine: NEGATIVE
Glucose, UA: NEGATIVE mg/dL
Ketones, ur: 40 mg/dL — AB
Nitrite: NEGATIVE
Protein, ur: 30 mg/dL — AB
Specific Gravity, Urine: 1.028 (ref 1.005–1.030)
pH: 5.5 (ref 5.0–8.0)

## 2021-06-13 MED ORDER — IBUPROFEN 800 MG PO TABS: 800.0000 mg | ORAL_TABLET | Freq: Three times a day (TID) | ORAL | 0 refills | Status: DC | PRN

## 2021-06-13 MED ORDER — KETOROLAC TROMETHAMINE 15 MG/ML IJ SOLN
15.0000 mg | Freq: Once | INTRAMUSCULAR | Status: AC
  Administered 2021-06-13: 15 mg via INTRAVENOUS
  Filled 2021-06-13: qty 1

## 2021-06-13 MED ORDER — ONDANSETRON HCL 4 MG/2ML IJ SOLN
4.0000 mg | Freq: Once | INTRAMUSCULAR | Status: AC
  Administered 2021-06-13: 4 mg via INTRAVENOUS
  Filled 2021-06-13: qty 2

## 2021-06-13 MED ORDER — ONDANSETRON 8 MG PO TBDP: 8.0000 mg | ORAL_TABLET | Freq: Three times a day (TID) | ORAL | 0 refills | Status: DC | PRN

## 2021-06-13 NOTE — ED Provider Notes (Signed)
[facility] Provider Note: Lowella Dell, MD, FACEP  CSN: [id number] MRN: [id number] ARRIVAL: 06/12/21 at 1824 ROOM: DB012/DB012   CHIEF COMPLAINT  Generalized Body Aches   HISTORY OF PRESENT ILLNESS  06/12/21 11:58 PM Jenny Davidson is a 60 y.o. female with 2 days of generalized body aches, loss of appetite, fatigue, headache and fever.  Her fever has been as high as 102, treated with ibuprofen with partial relief of symptoms.  She rates her headache as a 9 out of 10.  Today she has developed additional nausea without vomiting, nasal congestion, cough and a sense of heaviness in her chest that radiates into her back.  She denies shortness of breath or urinary symptoms.  She has had little to eat but continues drinking fluids.   Past Medical History:  Diagnosis Date   Allergy    Arthritis    Osteoporosis     Past Surgical History:  Procedure Laterality Date   ABDOMINAL HYSTERECTOMY     BREAST SURGERY     TUBAL LIGATION     tumore removed  2008    Family History  Problem Relation Age of Onset   Heart disease Father    Diabetes Father     Social History   Tobacco Use   Smoking status: Former   Smokeless tobacco: Never  Substance Use Topics   Alcohol use: Yes    Prior to Admission medications   Medication Sig Start Date End Date Taking? Authorizing Provider  ibuprofen (ADVIL) 800 MG tablet Take 1 tablet (800 mg total) by mouth every 8 (eight) hours as needed (For fever or pain). 06/13/21  Yes Aysiah Jurado, MD  ondansetron (ZOFRAN ODT) 8 MG disintegrating tablet Take 1 tablet (8 mg total) by mouth every 8 (eight) hours as needed for nausea or vomiting. 06/13/21  Yes Caymen Dubray, MD  amLODipine (NORVASC) 5 MG tablet TAKE 1 TABLET(5 MG) BY MOUTH DAILY 04/22/21   Shade Flood, MD  fexofenadine (ALLEGRA) 60 MG tablet Take 60 mg by mouth 2 (two) times daily.    [provider]  FLUoxetine (PROZAC) 20 MG tablet Take 2 tablets (40 mg total) by mouth daily.  12/19/20   Shade Flood, MD  FLUTICASONE FUROATE IN Inhale into the lungs.    [provider]  hydrOXYzine (ATARAX/VISTARIL) 25 MG tablet Take 0.5-1 tablets (12.5-25 mg total) by mouth at bedtime as needed. 12/19/20   Shade Flood, MD    Allergies Erythromycin and Iodine   REVIEW OF SYSTEMS  Negative except as noted here or in the History of Present Illness.   PHYSICAL EXAMINATION  Initial Vital Signs Blood pressure (!) 154/95, pulse 96, temperature 98.2 F (36.8 C), temperature source Oral, resp. rate 16, height 5\' 3"  (1.6 m), weight 63.5 kg, SpO2 99 %.  Examination General: Well-developed, well-nourished female in no acute distress; appearance consistent with age of record HENT: normocephalic; atraumatic Eyes: pupils equal, round and reactive to light; extraocular muscles intact Neck: supple Heart: regular rate and rhythm Lungs: clear to auscultation bilaterally Chest: Anterior chest wall tenderness Abdomen: soft; nondistended; nontender; bowel sounds present Extremities: No deformity; full range of motion; pulses normal Neurologic: Awake, alert and oriented; motor function intact in all extremities and symmetric; no facial droop Skin: Warm and dry Psychiatric: Flat affect   RESULTS  Summary of this visit's results, reviewed and interpreted by myself:   EKG Interpretation  Date/Time:  Wednesday June 12 2021 19:56:10 EDT Ventricular Rate:  92 PR Interval:  144 QRS Duration: 72 QT Interval:  320 QTC Calculation: 395 R Axis:   42 Text Interpretation: Normal sinus rhythm No previous ECGs available Confirmed by Paula Libra ([id number]) on 06/13/2021 12:07:18 AM       Laboratory Studies: Results for orders placed or performed during the hospital encounter of 06/12/21 (from the past 24 hour(s))  Resp Panel by RT-PCR (Flu A&B, Covid) Nasopharyngeal Swab     Status: None   Collection Time: 06/12/21  8:01 PM   Specimen: Nasopharyngeal Swab;  Nasopharyngeal(NP) swabs in vial transport medium  Result Value Ref Range   SARS Coronavirus 2 by RT PCR NEGATIVE NEGATIVE   Influenza A by PCR NEGATIVE NEGATIVE   Influenza B by PCR NEGATIVE NEGATIVE  Group A Strep by PCR     Status: None   Collection Time: 06/12/21  8:01 PM   Specimen: Nasopharyngeal Swab; Sterile Swab  Result Value Ref Range   Group A Strep by PCR NOT DETECTED NOT DETECTED  CBC with Differential/Platelet     Status: Abnormal   Collection Time: 06/13/21 12:25 AM  Result Value Ref Range   WBC 4.5 4.0 - 10.5 K/uL   RBC 5.59 (H) 3.87 - 5.11 MIL/uL   Hemoglobin 15.1 (H) 12.0 - 15.0 g/dL   HCT 35.4 (H) 56.2 - 56.3 %   MCV 82.5 80.0 - 100.0 fL   MCH 27.0 26.0 - 34.0 pg   MCHC 32.8 30.0 - 36.0 g/dL   RDW 89.3 73.4 - 28.7 %   Platelets 227 150 - 400 K/uL   nRBC 0.0 0.0 - 0.2 %   Neutrophils Relative % 55 %   Neutro Abs 2.5 1.7 - 7.7 K/uL   Lymphocytes Relative 29 %   Lymphs Abs 1.3 0.7 - 4.0 K/uL   Monocytes Relative 9 %   Monocytes Absolute 0.4 0.1 - 1.0 K/uL   Eosinophils Relative 6 %   Eosinophils Absolute 0.3 0.0 - 0.5 K/uL   Basophils Relative 1 %   Basophils Absolute 0.0 0.0 - 0.1 K/uL   Immature Granulocytes 0 %   Abs Immature Granulocytes 0.01 0.00 - 0.07 K/uL  Basic metabolic panel     Status: Abnormal   Collection Time: 06/13/21 12:25 AM  Result Value Ref Range   Sodium 139 135 - 145 mmol/L   Potassium 3.7 3.5 - 5.1 mmol/L   Chloride 104 98 - 111 mmol/L   CO2 23 22 - 32 mmol/L   Glucose, Bld 103 (H) 70 - 99 mg/dL   BUN 11 8 - 23 mg/dL   Creatinine, Ser 6.81 0.44 - 1.00 mg/dL   Calcium 9.0 8.9 - 15.7 mg/dL   GFR, Estimated >26 >20 mL/min   Anion gap 12 5 - 15  Urinalysis, Routine w reflex microscopic Urine, Clean Catch     Status: Abnormal   Collection Time: 06/13/21 12:25 AM  Result Value Ref Range   Color, Urine YELLOW YELLOW   APPearance CLEAR CLEAR   Specific Gravity, Urine 1.028 1.005 - 1.030   pH 5.5 5.0 - 8.0   Glucose, UA NEGATIVE  NEGATIVE mg/dL   Hgb urine dipstick SMALL (A) NEGATIVE   Bilirubin Urine NEGATIVE NEGATIVE   Ketones, ur 40 (A) NEGATIVE mg/dL   Protein, ur 30 (A) NEGATIVE mg/dL   Nitrite NEGATIVE NEGATIVE   Leukocytes,Ua MODERATE (A) NEGATIVE   RBC / HPF 0-5 0 - 5 RBC/hpf   WBC, UA 6-10 0 - 5 WBC/hpf   Squamous Epithelial / LPF 0-5 0 -  5   Mucus PRESENT    Imaging Studies: DG Chest 2 View  Result Date: 06/12/2021 CLINICAL DATA:  Chest pressure. EXAM: CHEST - 2 VIEW COMPARISON:  None. FINDINGS: The heart size and mediastinal contours are within normal limits. There is a calcified nodule in the left upper lobe. The lungs are otherwise clear. There the visualized skeletal structures are unremarkable. IMPRESSION: No active cardiopulmonary disease. Electronically Signed   By: Darliss Cheney M.D.   On: 06/12/2021 20:32    ED COURSE and MDM  Nursing notes, initial and subsequent vitals signs, including pulse oximetry, reviewed and interpreted by myself.  Vitals:   06/13/21 0000 06/13/21 0022 06/13/21 0100 06/13/21 0200  BP: (!) 125/91 (!) 125/91 119/78 114/74  Pulse: 94 94 87 86  Resp:  16 18 18   Temp:      TempSrc:      SpO2: 98% 99% 94% 96%  Weight:      Height:       Medications  ketorolac (TORADOL) 15 MG/ML injection 15 mg (15 mg Intravenous Given 06/13/21 0029)  ondansetron (ZOFRAN) injection 4 mg (4 mg Intravenous Given 06/13/21 0029)    Patient feeling better after IV medications.  Presentation consistent with a viral illness although she is negative for COVID and influenza.  There are other viruses present in the community at this time including RSV.  We will treat her symptomatically.   PROCEDURES  Procedures   ED DIAGNOSES     ICD-10-CM   1. Viral illness  B34.9          Llewelyn Sheaffer, MD 06/13/21 [phone]

## 2021-06-13 NOTE — Telephone Encounter (Signed)
Pt wanted to know if she needs to be seen in office or is it ok to a video visit [name]

## 2021-06-14 NOTE — Telephone Encounter (Signed)
Virtual okay

## 2021-06-17 ENCOUNTER — Encounter: Payer: Self-pay | Admitting: Family Medicine

## 2021-06-17 ENCOUNTER — Telehealth: Payer: 59 | Admitting: Family Medicine

## 2021-06-17 ENCOUNTER — Other Ambulatory Visit: Payer: Self-pay

## 2021-06-17 ENCOUNTER — Telehealth (INDEPENDENT_AMBULATORY_CARE_PROVIDER_SITE_OTHER): Payer: 59 | Admitting: Family Medicine

## 2021-06-17 VITALS — Temp 100.2°F

## 2021-06-17 DIAGNOSIS — R350 Frequency of micturition: Secondary | ICD-10-CM

## 2021-06-17 DIAGNOSIS — R82998 Other abnormal findings in urine: Secondary | ICD-10-CM

## 2021-06-17 DIAGNOSIS — M546 Pain in thoracic spine: Secondary | ICD-10-CM

## 2021-06-17 DIAGNOSIS — B349 Viral infection, unspecified: Secondary | ICD-10-CM

## 2021-06-17 DIAGNOSIS — R509 Fever, unspecified: Secondary | ICD-10-CM

## 2021-06-17 MED ORDER — SULFAMETHOXAZOLE-TRIMETHOPRIM 800-160 MG PO TABS: 1.0000 | ORAL_TABLET | Freq: Two times a day (BID) | ORAL | 0 refills | Status: DC

## 2021-06-17 NOTE — Progress Notes (Signed)
Virtual Visit via Video Note  I connected with [facility] on 06/17/21 at 9:29 AM by a video enabled telemedicine application and verified that I am speaking with the correct person using two identifiers.  Patient location:home - by self My location: office - [place].    I discussed the limitations, risks, security and privacy concerns of performing an evaluation and management service by telephone and the availability of in person appointments. I also discussed with the patient that there may be a patient responsible charge related to this service. The patient expressed understanding and agreed to proceed, consent obtained  Chief complaint:  Chief Complaint  Patient presents with   Follow-up    Pt reports "viral illness" given ibuprofen and anti nausea, reports fever returns as soon as ibuprofen wears off, feels body aches     History of Present Illness: Jenny Davidson is a 60 y.o. female  Viral illness Video visit/virtual visit with me on November 2.  Body aches, headache 2 nights prior.  Reported sensation of something sitting on her chest, worsened with activity, improved with rest.  With family history of early CAD and personal history of hypertension recommended ER visit.  ED note reviewed from [facility]: Respiratory panel was negative for COVID 19, and flu.  BMP overall reassuring with glucose 103, otherwise normal WBC 4.5, hemoglobin 15.1, CBC otherwise reassuring.  Urinalysis with moderate leukocytes, 6-10 WBC on micro.  Rapid strep negative.  Chest x-ray no active cardiopulmonary disease.  EKG normal sinus rhythm without acute findings.  Treated with Zofran, Toradol with improvement in the ER.  Still suspected viral illness, with differential including RSV.  Symptomatic care discussed.  Since ER visit Feels better when taking ibuprofen, but tried off med last night. Woke up late night with temp 102.5. usually around 100.  No dysuria, no frequency, hematuria.  Occasional soreness under ribcage - vomited once Saturday. Better yesterday, able to eat yesterday. Has not had to use zofran yesterday. Drinking fluids. No chest pains - chest is better. Heating pad helped. No cough.congestion/runny nose. Some improvement in body aches. Drinking more fluids, but some increased urination. Still some mid back pain, better with ibuprofen/heating pad.    Patient Active Problem List   Diagnosis Date Noted   Seasonal allergies 12/09/2013   Osteopenia 12/09/2013   Past Medical History:  Diagnosis Date   Allergy    Arthritis    Osteoporosis    Past Surgical History:  Procedure Laterality Date   ABDOMINAL HYSTERECTOMY     BREAST SURGERY     TUBAL LIGATION     tumore removed  2008   Allergies  Allergen Reactions   Erythromycin Nausea And Vomiting   Iodine Rash   Prior to Admission medications   Medication Sig Start Date End Date Taking? Authorizing Provider  amLODipine (NORVASC) 5 MG tablet TAKE 1 TABLET(5 MG) BY MOUTH DAILY 04/22/21  Yes Shade Flood, MD  fexofenadine (ALLEGRA) 60 MG tablet Take 60 mg by mouth 2 (two) times daily.   Yes [provider]  FLUoxetine (PROZAC) 20 MG tablet Take 2 tablets (40 mg total) by mouth daily. 12/19/20  Yes Shade Flood, MD  FLUTICASONE FUROATE IN Inhale into the lungs.   Yes [provider]  hydrOXYzine (ATARAX/VISTARIL) 25 MG tablet Take 0.5-1 tablets (12.5-25 mg total) by mouth at bedtime as needed. 12/19/20  Yes Shade Flood, MD  ibuprofen (ADVIL) 800 MG tablet Take 1 tablet (800 mg total) by mouth every 8 (eight)  hours as needed (For fever or pain). 06/13/21  Yes Molpus, John, MD  ondansetron (ZOFRAN ODT) 8 MG disintegrating tablet Take 1 tablet (8 mg total) by mouth every 8 (eight) hours as needed for nausea or vomiting. 06/13/21  Yes Molpus, John, MD   Social History   Socioeconomic History   Marital status: Married    Spouse name: Not on file   Number of children: Not on file    Years of education: Not on file   Highest education level: Not on file  Occupational History   Not on file  Tobacco Use   Smoking status: Former   Smokeless tobacco: Never  Substance and Sexual Activity   Alcohol use: Yes   Drug use: Not on file   Sexual activity: Not on file  Other Topics Concern   Not on file  Social History Narrative   Not on file   Social Determinants of Health   Financial Resource Strain: Not on file  Food Insecurity: Not on file  Transportation Needs: Not on file  Physical Activity: Not on file  Stress: Not on file  Social Connections: Not on file  Intimate Partner Violence: Not on file    Observations/Objective: Vitals:   06/17/21 0916  Temp: 100.2 F (37.9 C)  Nontoxic appearance on video.  Speaking in full sentences.  Euthymic mood.  Coherent responses.  No respiratory distress.  Locates area of discomfort at flanks/mid back bilaterally.  All questions were answered with understanding of plan expressed   Assessment and Plan: Leukocytes in urine - Plan: sulfamethoxazole-trimethoprim (BACTRIM DS) 800-160 MG tablet  Urinary frequency - Plan: sulfamethoxazole-trimethoprim (BACTRIM DS) 800-160 MG tablet  Fever, unspecified fever cause - Plan: sulfamethoxazole-trimethoprim (BACTRIM DS) 800-160 MG tablet  Bilateral thoracic back pain, unspecified chronicity  Viral illness  Patient suspected viral illness, reassuring evaluation through the ER, negative COVID and flu testing, differential includes RSV.  However has had continued fevers, still some flank/back pain but improved with ibuprofen.  Some urinary frequency but attributed to increase fluid intake.  She did have leukocytes present on her urinalysis along with WBC on micro.  Question of possible atypical urinary tract infection, possible pyelonephritis with upper back pain, fever, nausea and vomiting.  -Continue symptomatic care with Tylenol, ibuprofen for fever, arthralgias/myalgias  -Start  Septra DS for 7 days, potential side effects of meds discussed.  -48-hour follow-up, virtual visit or in office if not improving.  -ER/urgent care precautions given, note for work provided.   Follow Up Instructions: 2 days.    I discussed the assessment and treatment plan with the patient. The patient was provided an opportunity to ask questions and all were answered. The patient agreed with the plan and demonstrated an understanding of the instructions.   The patient was advised to call back or seek an in-person evaluation if the symptoms worsen or if the condition fails to improve as anticipated.  Wendie Agreste, MD

## 2021-06-20 ENCOUNTER — Encounter: Payer: Self-pay | Admitting: Family Medicine

## 2021-06-21 ENCOUNTER — Ambulatory Visit (HOSPITAL_BASED_OUTPATIENT_CLINIC_OR_DEPARTMENT_OTHER)
Admission: RE | Admit: 2021-06-21 | Discharge: 2021-06-21 | Disposition: A | Discharge status: Home / Self Care | Payer: 59 | Source: Ambulatory Visit | Attending: Family Medicine | Admitting: Family Medicine

## 2021-06-21 ENCOUNTER — Encounter: Payer: Self-pay | Admitting: Family Medicine

## 2021-06-21 ENCOUNTER — Ambulatory Visit (INDEPENDENT_AMBULATORY_CARE_PROVIDER_SITE_OTHER): Payer: 59 | Admitting: Family Medicine

## 2021-06-21 ENCOUNTER — Other Ambulatory Visit: Payer: Self-pay

## 2021-06-21 VITALS — BP 104/72 | HR 110 | Temp 98.2°F | Resp 16 | Wt 148.8 lb

## 2021-06-21 DIAGNOSIS — R1010 Upper abdominal pain, unspecified: Secondary | ICD-10-CM | POA: Insufficient documentation

## 2021-06-21 DIAGNOSIS — R509 Fever, unspecified: Secondary | ICD-10-CM | POA: Diagnosis not present

## 2021-06-21 DIAGNOSIS — M549 Dorsalgia, unspecified: Secondary | ICD-10-CM | POA: Insufficient documentation

## 2021-06-21 LAB — POCT URINALYSIS DIPSTICK
Bilirubin, UA: NEGATIVE
Blood, UA: NEGATIVE
Glucose, UA: NEGATIVE
Ketones, UA: NEGATIVE
Leukocytes, UA: NEGATIVE
Nitrite, UA: NEGATIVE
Protein, UA: POSITIVE — AB
Spec Grav, UA: 1.01 (ref 1.010–1.025)
Urobilinogen, UA: 0.2 E.U./dL
pH, UA: 6 (ref 5.0–8.0)

## 2021-06-21 MED ORDER — ONDANSETRON 4 MG PO TBDP: 4.0000 mg | ORAL_TABLET | Freq: Three times a day (TID) | ORAL | 0 refills | Status: DC | PRN

## 2021-06-21 MED ORDER — IOHEXOL 300 MG/ML  SOLN
100.0000 mL | Freq: Once | INTRAMUSCULAR | Status: AC | PRN
  Administered 2021-06-21: 100 mL via INTRAVENOUS

## 2021-06-21 NOTE — Patient Instructions (Addendum)
We will notify you of your imaging results and determine the next steps IF you are able to provide a urine, please do so before you leave Continue to drink LOTS of fluids- you don't want to get dehydrated Use the Zofran as needed for nausea Call with any questions or concerns Hang in there!!

## 2021-06-21 NOTE — Progress Notes (Signed)
   Subjective:    Patient ID: Jenny Davidson, female    DOB: 05-25-1961, 60 y.o.   MRN: [id number]  HPI Fever- Pt had a video visit w/ Dr Neva Seat on 11/2 and dx'd w/ viral illness.  She went to ER later that day and had a negative work up.  She had a 2nd video visit on 11/7 and was started on Bactrim due to leuks in urine obtained in ER.  Pt reports fevers will break overnight but climb as the day goes on.  101.3 last night.  + HA, nausea, pain from back radiating around to front.  + loss of appetite.  Pt reports drinking fluids.  Denies cough and congestion.  No blood in urine.  Urine has a strong odor.  Some dysuria.  Denies frequency.  No urgency.  No hx of kidney stones.   Review of Systems For ROS see HPI   This visit occurred during the SARS-CoV-2 public health emergency.  Safety protocols were in place, including screening questions prior to the visit, additional usage of staff PPE, and extensive cleaning of exam room while observing appropriate contact time as indicated for disinfecting solutions.      Objective:   Physical Exam Vitals reviewed.  Constitutional:      General: She is not in acute distress.    Appearance: She is ill-appearing.  HENT:     Head: Normocephalic and atraumatic.  Eyes:     Extraocular Movements: Extraocular movements intact.     Conjunctiva/sclera: Conjunctivae normal.     Pupils: Pupils are equal, round, and reactive to light.  Cardiovascular:     Rate and Rhythm: Normal rate and regular rhythm.  Pulmonary:     Effort: Pulmonary effort is normal. No respiratory distress.     Breath sounds: No wheezing.  Abdominal:     Tenderness: There is abdominal tenderness (diffuse TTP of upper abdomen). There is right CVA tenderness, left CVA tenderness and guarding (voluntary).  Musculoskeletal:     Right lower leg: No edema.     Left lower leg: No edema.  Skin:    General: Skin is warm and dry.     Coloration: Skin is pale.  Neurological:     General: No  focal deficit present.     Mental Status: She is alert and oriented to person, place, and time.          Assessment & Plan:  Fever unknown source/CVA tenderness/abd pain- this is pt's 4th evaluation in 10 days.  Per pt and her husband, any potential URI sxs have resolved but her abd pain, back pain, nausea, and fever persist.  She is exquisitely TTP on exam and I can barely touch her w/o her flinching.  She is afebrile in office but husband reports temp was 101.3 last night.  UA is unremarkable and pt is on Bactrim.  No culture was done on the urine obtained in ED so there is the possibility of resistant bacteria which has progressed to pyelo.  A stone is also a possibility.  As is atypical diverticulitis or even discitis.  At this time, pt needs CT scan for evaluation.  Will determine next steps once CT results are available.  Pt expressed understanding and is in agreement w/ plan.

## 2021-06-24 ENCOUNTER — Ambulatory Visit (INDEPENDENT_AMBULATORY_CARE_PROVIDER_SITE_OTHER): Payer: 59 | Admitting: Family Medicine

## 2021-06-24 ENCOUNTER — Encounter: Payer: Self-pay | Admitting: Family Medicine

## 2021-06-24 VITALS — BP 128/70 | HR 100 | Temp 98.7°F | Resp 16 | Ht 63.0 in | Wt 150.2 lb

## 2021-06-24 DIAGNOSIS — R1013 Epigastric pain: Secondary | ICD-10-CM

## 2021-06-24 DIAGNOSIS — R0789 Other chest pain: Secondary | ICD-10-CM | POA: Diagnosis not present

## 2021-06-24 DIAGNOSIS — R1012 Left upper quadrant pain: Secondary | ICD-10-CM

## 2021-06-24 DIAGNOSIS — R509 Fever, unspecified: Secondary | ICD-10-CM | POA: Diagnosis not present

## 2021-06-24 DIAGNOSIS — R112 Nausea with vomiting, unspecified: Secondary | ICD-10-CM | POA: Diagnosis not present

## 2021-06-24 DIAGNOSIS — R0609 Other forms of dyspnea: Secondary | ICD-10-CM

## 2021-06-24 DIAGNOSIS — M791 Myalgia, unspecified site: Secondary | ICD-10-CM

## 2021-06-24 NOTE — Progress Notes (Signed)
Subjective:  Patient ID: Jenny Davidson, female    DOB: February 06, 1961  Age: 60 y.o. MRN: [id number]  CC:  Chief Complaint  Patient presents with   Nausea    Pt reports was sent to ED for chest heaviness, loss of appetite, nausea, has not vomitted but notes Jenny Davidson is still gaining weight despite not eating     HPI Jenny Davidson presents for   Fever, malaise, abdominal/back pain See prior notes.  Suspected viral illness on November 2 with fever, diffuse myalgias, headache, body aches and fatigue starting 2 days prior (initial sx's 10/31)  T-max 102 night prior.  Minimal increased urination but no hematuria/dysuria.  Also complained of sensation of heaviness in the chest.  Family history of early CAD.  Sent to ER for evaluation.  EKG, strep testing, respiratory panel and chest x-ray all reassuring.  Glucose borderline at 103 otherwise normal BMP.  Urinalysis with moderate leukocytes.  However 40 ketones and 30 protein.  CBC with borderline hemoglobin of 15.1 with WBC 4.5.  Treated with Toradol, Zofran and discharged with likely viral illness.  Repeat video visit 06/17/2021.  Still intermittent fevers, temp 1-2.5 on 11 6.  Chest symptoms were improving at that time.  Some improvement in body aches.  Still some increased urination, mid back pain but improved with ibuprofen/heating pad.  Continue symptomatic care with Tylenol, ibuprofen for fevers, started on Septra DS for 7 days for possible UTI given leukocytes and urine and urinary frequency, persistent fever to treat for possible pyelonephritis.  Office visit November 11 with Dr. Birdie Riddle.  Still persistent fevers at that time, 1-1.3 night prior.  Headache, nausea, back pain radiating to the front with loss of appetite.  No cough or congestion.  Urinalysis with protein but no leukocytes.  Ordered CT abdomen, continued fluids, Zofran if needed for nausea.  CT abdomen pelvis ordered indicating 5 cm right adnexal simple appearing cyst with plan follow-up  ultrasound in 6 to 12 months.  No other acute process in the abdomen or pelvis.  Small hiatal hernia and aortic atherosclerosis.  No known hx of ovarian cancer.   Still having fevers. Temp 100.5 yesterday. Taking advil and tylenol every 4 hours, not yet today.  Still does not feel good, but slightly better. Less body aches, more energy.  Still no appetite, still some upper abdominal pain. Last emesis 2 days ago.  Finished bactrim. No dysuria, no hematuria. Still with urinary frequency.  Slight heaviness in chest, but better  - last felt 2 days ago at rest.  No new swelling, minimal food intake, some rice, crackers, soup. but gaining weight.  Drinking fluids.  Episode of cough past 2 days, nausea afterward. No cough today.  some dyspnea with minimal activity past 2 weeks, not at rest (usually active walking at work prior).  Last CXR on 06/12/22 - no active cardiopulmonary disease.  Still some upper abd pain. R back pain. 1 alcohol drink per week.  Wt Readings from Last 3 Encounters:  06/24/21 150 lb 3.2 oz (68.1 kg)  06/21/21 148 lb 12.8 oz (67.5 kg)  06/12/21 140 lb (63.5 kg)  11/2 weight home reported.   History Patient Active Problem List   Diagnosis Date Noted   Seasonal allergies 12/09/2013   Osteopenia 12/09/2013   Past Medical History:  Diagnosis Date   Allergy    Arthritis    Osteoporosis    Past Surgical History:  Procedure Laterality Date   ABDOMINAL HYSTERECTOMY     BREAST SURGERY  TUBAL LIGATION     tumore removed  2008   Allergies  Allergen Reactions   Erythromycin Nausea And Vomiting   Iodine Rash    Pt states this is topical betadine   Prior to Admission medications   Medication Sig Start Date End Date Taking? Authorizing Provider  amLODipine (NORVASC) 5 MG tablet TAKE 1 TABLET(5 MG) BY MOUTH DAILY 04/22/21  Yes Shade Flood, MD  fexofenadine (ALLEGRA) 60 MG tablet Take 60 mg by mouth 2 (two) times daily.   Yes [provider]   FLUTICASONE FUROATE IN Inhale into the lungs.   Yes [provider]  hydrOXYzine (ATARAX/VISTARIL) 25 MG tablet Take 0.5-1 tablets (12.5-25 mg total) by mouth at bedtime as needed. 12/19/20  Yes Shade Flood, MD  ibuprofen (ADVIL) 800 MG tablet Take 1 tablet (800 mg total) by mouth every 8 (eight) hours as needed (For fever or pain). 06/13/21  Yes Molpus, John, MD  ondansetron (ZOFRAN ODT) 4 MG disintegrating tablet Take 1 tablet (4 mg total) by mouth every 8 (eight) hours as needed for nausea or vomiting. 06/21/21  Yes Sheliah Hatch, MD  FLUoxetine (PROZAC) 20 MG tablet Take 2 tablets (40 mg total) by mouth daily. Patient not taking: No sig reported 12/19/20   Shade Flood, MD  sulfamethoxazole-trimethoprim (BACTRIM DS) 800-160 MG tablet Take 1 tablet by mouth 2 (two) times daily. Patient not taking: No sig reported 06/17/21   Shade Flood, MD   Social History   Socioeconomic History   Marital status: Married    Spouse name: Not on file   Number of children: Not on file   Years of education: Not on file   Highest education level: Not on file  Occupational History   Not on file  Tobacco Use   Smoking status: Former   Smokeless tobacco: Never  Substance and Sexual Activity   Alcohol use: Yes   Drug use: Not on file   Sexual activity: Not on file  Other Topics Concern   Not on file  Social History Narrative   Not on file   Social Determinants of Health   Financial Resource Strain: Not on file  Food Insecurity: Not on file  Transportation Needs: Not on file  Physical Activity: Not on file  Stress: Not on file  Social Connections: Not on file  Intimate Partner Violence: Not on file    Review of Systems Per HPI.   Objective:   Vitals:   06/24/21 1322  BP: 128/70  Pulse: 100  Resp: 16  Temp: 98.7 F (37.1 C)  TempSrc: Temporal  SpO2: 98%  Weight: 150 lb 3.2 oz (68.1 kg)  Height: 5\' 3"  (1.6 m)    Physical Exam Vitals reviewed.   Constitutional:      General: Jenny Davidson is not in acute distress.    Appearance: Normal appearance. Jenny Davidson is well-developed. Jenny Davidson is not ill-appearing, toxic-appearing or diaphoretic.  HENT:     Head: Normocephalic and atraumatic.  Eyes:     Conjunctiva/sclera: Conjunctivae normal.     Pupils: Pupils are equal, round, and reactive to light.  Neck:     Vascular: No carotid bruit.  Cardiovascular:     Rate and Rhythm: Regular rhythm. Tachycardia present.     Heart sounds: Normal heart sounds. No murmur heard.   No friction rub.  Pulmonary:     Effort: Pulmonary effort is normal. No respiratory distress.     Breath sounds: Normal breath sounds. No stridor. No  wheezing, rhonchi or rales.  Abdominal:     General: There is no distension.     Palpations: Abdomen is soft. There is no pulsatile mass.     Tenderness: There is abdominal tenderness (Epigastric, left upper quadrant). There is right CVA tenderness. There is no left CVA tenderness, guarding or rebound.  Musculoskeletal:     Comments: Neck/lumbar spine no midline bony tenderness.  Slight CVA/paraspinal tenderness on the right.   Trace nonpitting pedal edema bilaterally at ankles.   Skin:    General: Skin is warm and dry.     Comments: No rash, normal turgor.  Neurological:     Mental Status: Jenny Davidson is alert and oriented to person, place, and time.  Psychiatric:        Mood and Affect: Mood normal.        Behavior: Behavior normal.    EKG: Sinus rhythm, rate 98.  Low voltage precordial leads.  No apparent acute ST or T wave changes.  Some artifact and lead III, aVL, aVF.  No apparent acute changes from 06/12/21   Assessment & Plan:  Ragad Rothrock is a 60 y.o. female . Nausea and vomiting, unspecified vomiting type - Plan: Comprehensive metabolic panel, Lipase  Epigastric abdominal pain - Plan: CBC  LUQ abdominal pain - Plan: Comprehensive metabolic panel  Chest heaviness - Plan: Brain natriuretic peptide  DOE (dyspnea on  exertion)  Fever, unspecified fever cause - Plan: Epstein-Barr virus VCA, IgM, CMV IgM, CBC  Myalgia - Plan: Epstein-Barr virus VCA, IgM, CMV IgM Persistent low-grade fevers.  Slight subjective improvement past few days but still with persistent nausea, episodic vomiting, abdominal and right-sided flank/low back pain without definite cause on CT.  Still with some chest symptoms as above.  Deconditioning versus other cause of dyspnea on exertion.  Lungs were clear on exam, no acute findings on EKG, appears euvolemic.  Slight weight gain may be related to decreased activity, but still decreased appetite.  -Check a BNP to evaluate for CHF because of dyspnea on exertion.   -Possible viral illness, check CMV, EBV.  Previous viral panel through ER was negative/normal.  -Repeat CBC for fever, lipase with nausea/vomiting, abdominal pain and CMP to evaluate LFTs.  -ER precautions given, 48-hour follow-up.  Continue fluids, bland diet as tolerated.  Nutrition supplement if needed.  No orders of the defined types were placed in this encounter.  Patient Instructions  I will check some labs as discussed. If any worsening symptoms proceed to ER as we discussed.  Continue to drink fluids, supplement such as Ensure or Boost is ok if tolerated.  Recheck in 2 days.  Return to the clinic or go to the nearest emergency room if any of your symptoms worsen or new symptoms occur.    Signed,   Merri Ray, MD [place] 06/24/21 2:20 PM

## 2021-06-24 NOTE — Patient Instructions (Signed)
I will check some labs as discussed. If any worsening symptoms proceed to ER as we discussed.  Continue to drink fluids, supplement such as Ensure or Boost is ok if tolerated.  Recheck in 2 days.  Return to the clinic or go to the nearest emergency room if any of your symptoms worsen or new symptoms occur.

## 2021-06-25 ENCOUNTER — Other Ambulatory Visit (HOSPITAL_BASED_OUTPATIENT_CLINIC_OR_DEPARTMENT_OTHER): Payer: 59

## 2021-06-25 ENCOUNTER — Other Ambulatory Visit: Payer: Self-pay | Admitting: Family Medicine

## 2021-06-25 DIAGNOSIS — R1013 Epigastric pain: Secondary | ICD-10-CM

## 2021-06-25 DIAGNOSIS — D72829 Elevated white blood cell count, unspecified: Secondary | ICD-10-CM

## 2021-06-25 DIAGNOSIS — R7989 Other specified abnormal findings of blood chemistry: Secondary | ICD-10-CM

## 2021-06-25 DIAGNOSIS — R748 Abnormal levels of other serum enzymes: Secondary | ICD-10-CM

## 2021-06-25 LAB — COMPREHENSIVE METABOLIC PANEL
ALT: 128 U/L — ABNORMAL HIGH (ref 0–35)
AST: 122 U/L — ABNORMAL HIGH (ref 0–37)
Albumin: 3.6 g/dL (ref 3.5–5.2)
Alkaline Phosphatase: 206 U/L — ABNORMAL HIGH (ref 39–117)
BUN: 13 mg/dL (ref 6–23)
CO2: 26 mEq/L (ref 19–32)
Calcium: 8.4 mg/dL (ref 8.4–10.5)
Chloride: 105 mEq/L (ref 96–112)
Creatinine, Ser: 0.84 mg/dL (ref 0.40–1.20)
GFR: 74.38 mL/min (ref >60.00)
Glucose, Bld: 93 mg/dL (ref 70–99)
Potassium: 3.8 mEq/L (ref 3.5–5.1)
Sodium: 140 mEq/L (ref 135–145)
Total Bilirubin: 0.5 mg/dL (ref 0.2–1.2)
Total Protein: 6.3 g/dL (ref 6.0–8.3)

## 2021-06-25 LAB — LIPASE: Lipase: 66 U/L — ABNORMAL HIGH (ref 11.0–59.0)

## 2021-06-25 LAB — CBC
HCT: 43.1 % (ref 36.0–46.0)
Hemoglobin: 14.1 g/dL (ref 12.0–15.0)
MCHC: 32.6 g/dL (ref 30.0–36.0)
MCV: 80.9 fl (ref 78.0–100.0)
Platelets: 264 10*3/uL (ref 150.0–400.0)
RBC: 5.33 Mil/uL — ABNORMAL HIGH (ref 3.87–5.11)
RDW: 14.4 % (ref 11.5–15.5)
WBC: 14 10*3/uL — ABNORMAL HIGH (ref 4.0–10.5)

## 2021-06-25 LAB — BRAIN NATRIURETIC PEPTIDE: Pro B Natriuretic peptide (BNP): 11 pg/mL (ref 0.0–100.0)

## 2021-06-25 LAB — EPSTEIN-BARR VIRUS VCA, IGM: EBV VCA IgM: >160 U/mL — ABNORMAL HIGH

## 2021-06-25 LAB — CMV IGM: CMV IgM: 223 AU/mL — ABNORMAL HIGH

## 2021-06-26 ENCOUNTER — Other Ambulatory Visit (INDEPENDENT_AMBULATORY_CARE_PROVIDER_SITE_OTHER): Payer: 59

## 2021-06-26 DIAGNOSIS — R1013 Epigastric pain: Secondary | ICD-10-CM

## 2021-06-26 DIAGNOSIS — R748 Abnormal levels of other serum enzymes: Secondary | ICD-10-CM

## 2021-06-26 DIAGNOSIS — D72829 Elevated white blood cell count, unspecified: Secondary | ICD-10-CM | POA: Diagnosis not present

## 2021-06-26 LAB — CBC WITH DIFFERENTIAL/PLATELET
Basophils Absolute: 0.1 10*3/uL (ref 0.0–0.1)
Basophils Relative: 0.5 % (ref 0.0–3.0)
Eosinophils Absolute: 0.2 10*3/uL (ref 0.0–0.7)
Eosinophils Relative: 1.8 % (ref 0.0–5.0)
HCT: 42.1 % (ref 36.0–46.0)
Hemoglobin: 13.5 g/dL (ref 12.0–15.0)
Lymphocytes Relative: 63.8 % — ABNORMAL HIGH (ref 12.0–46.0)
Lymphs Abs: 8.8 10*3/uL — ABNORMAL HIGH (ref 0.7–4.0)
MCHC: 32.1 g/dL (ref 30.0–36.0)
MCV: 81.7 fl (ref 78.0–100.0)
Monocytes Absolute: 1.5 10*3/uL — ABNORMAL HIGH (ref 0.1–1.0)
Monocytes Relative: 10.8 % (ref 3.0–12.0)
Neutro Abs: 3.2 10*3/uL (ref 1.4–7.7)
Neutrophils Relative %: 23.1 % — ABNORMAL LOW (ref 43.0–77.0)
Platelets: 263 10*3/uL (ref 150.0–400.0)
RBC: 5.16 Mil/uL — ABNORMAL HIGH (ref 3.87–5.11)
RDW: 14.4 % (ref 11.5–15.5)
WBC: 13.7 10*3/uL — ABNORMAL HIGH (ref 4.0–10.5)

## 2021-06-26 LAB — HEPATIC FUNCTION PANEL
ALT: 143 U/L — ABNORMAL HIGH (ref 0–35)
AST: 131 U/L — ABNORMAL HIGH (ref 0–37)
Albumin: 3.5 g/dL (ref 3.5–5.2)
Alkaline Phosphatase: 181 U/L — ABNORMAL HIGH (ref 39–117)
Bilirubin, Direct: 0.1 mg/dL (ref 0.0–0.3)
Total Bilirubin: 0.5 mg/dL (ref 0.2–1.2)
Total Protein: 6.2 g/dL (ref 6.0–8.3)

## 2021-06-26 LAB — LIPASE: Lipase: 65 U/L — ABNORMAL HIGH (ref 11.0–59.0)

## 2021-06-27 ENCOUNTER — Ambulatory Visit (INDEPENDENT_AMBULATORY_CARE_PROVIDER_SITE_OTHER): Payer: 59 | Admitting: Family Medicine

## 2021-06-27 VITALS — BP 122/68 | HR 95 | Temp 98.5°F | Resp 16 | Ht 63.0 in | Wt 151.0 lb

## 2021-06-27 DIAGNOSIS — R748 Abnormal levels of other serum enzymes: Secondary | ICD-10-CM | POA: Diagnosis not present

## 2021-06-27 DIAGNOSIS — B2719 Cytomegaloviral mononucleosis with other complication: Secondary | ICD-10-CM

## 2021-06-27 DIAGNOSIS — R7401 Elevation of levels of liver transaminase levels: Secondary | ICD-10-CM

## 2021-06-27 NOTE — Patient Instructions (Signed)
Repeat labs tomorrow morning.  Try Boost or Ensure for calories, continue to drink fluids.  Depending on labs tomorrow, may discuss other workup or testing, possibly discussion with infectious disease.  Return to the clinic or go to the nearest emergency room if any of your symptoms worsen or new symptoms occur.

## 2021-06-27 NOTE — Progress Notes (Signed)
Subjective:  Patient ID: Jenny Davidson, female    DOB: 08-28-1960  Age: 60 y.o. MRN: [id number]  CC:  Chief Complaint  Patient presents with   Fatigue    Pt reports fatigue, light fevers in the afternoon, pt concerned as she is up 1lb from 2 days ago when she complains she is barily eating    Form Completion    Pt would like you to begin filling out FMLA paperwork     HPI [facility] presents for   Fever, fatigue, viral syndrome See previous notes.  Intermittent but persistent fevers since approximately 10/31. Initial respiratory panel in the ER on 06/12/2021 was normal.  EKG, strep testing normal at that time. Treated for possible urinary tract infection, concern for pyelonephritis with urinalysis having moderate leukocytes, no urine culture.  Treated with 7 days of Septra DS.  November 11 visit with Dr. Birdie Riddle, abdominal pain, flank pain with guarding on exam.  CT scan without apparent cause of symptoms, right adnexal cyst, simple appearing, plan for repeat ultrasound 6 to 12 months.  Last visit 3 days ago, still with intermittent fevers, malaise, fatigue, decreased appetite and upper abdominal discomfort.  Chest symptoms were improving, previous heaviness had improved.  Slight weight gain without signs of fluid overload.  Episodic cough with nausea.  Some dyspnea with activity for the previous 2 weeks but not at rest. Brain natruretic peptide was normal. She did have a leukocytosis with a white count of 14.0. Lipase mildly elevated at 66.  Transaminitis with AST 122, ALT 128, alk phos 206.  EBV IgM and CMV IgM were positive.  Suspected mono versus CMV infection with reactive transaminitis.  Called with results 2 days ago and symptoms were improving.   Repeat labs ordered for yesterday, with overall stable lipase at 65, WBC 13.7 with lymphocytic predominance of 63.8.  Low neutrophils at 23.1.  Alk phos improved to 181, minimal change from AST, ALT with AST 131, ALT 143 up from 122, 128  prior. Wt Readings from Last 3 Encounters:  06/27/21 151 lb (68.5 kg)  06/24/21 150 lb 3.2 oz (68.1 kg)  06/21/21 148 lb 12.8 oz (67.5 kg)   Feels about the same, but some increased appetite. Broccoli cheese soup and crackers yesterday. Dry cereal today. Has not started nutritional supplement.  No vomiting since last visit. Upper abdominal pain about the same. Normal BM's. No blood, no diarrhea.  Drinking fluids, normal urination.  Not active - sleeping about 20 hrs per day. No recent chest pain, no dyspnea at rest. Still winded with exertion.  Rare headache  - not constant.  No change in vision.  Spouse has noticed some improvement, just slow improvement.      Has been placed on leave at work. Will need FMLA paperwork completed.   History Patient Active Problem List   Diagnosis Date Noted   Seasonal allergies 12/09/2013   Osteopenia 12/09/2013   Past Medical History:  Diagnosis Date   Allergy    Arthritis    Osteoporosis    Past Surgical History:  Procedure Laterality Date   ABDOMINAL HYSTERECTOMY     BREAST SURGERY     TUBAL LIGATION     tumore removed  2008   Allergies  Allergen Reactions   Erythromycin Nausea And Vomiting   Iodine Rash    Pt states this is topical betadine   Prior to Admission medications   Medication Sig Start Date End Date Taking? Authorizing Provider  amLODipine (NORVASC) 5 MG  tablet TAKE 1 TABLET(5 MG) BY MOUTH DAILY 04/22/21  Yes Wendie Agreste, MD  fexofenadine (ALLEGRA) 60 MG tablet Take 60 mg by mouth 2 (two) times daily.   Yes [provider]  FLUTICASONE FUROATE IN Inhale into the lungs.   Yes [provider]  hydrOXYzine (ATARAX/VISTARIL) 25 MG tablet Take 0.5-1 tablets (12.5-25 mg total) by mouth at bedtime as needed. 12/19/20  Yes Wendie Agreste, MD  ibuprofen (ADVIL) 800 MG tablet Take 1 tablet (800 mg total) by mouth every 8 (eight) hours as needed (For fever or pain). 06/13/21  Yes Molpus, John, MD   ondansetron (ZOFRAN ODT) 4 MG disintegrating tablet Take 1 tablet (4 mg total) by mouth every 8 (eight) hours as needed for nausea or vomiting. 06/21/21  Yes Midge Minium, MD  FLUoxetine (PROZAC) 20 MG tablet Take 2 tablets (40 mg total) by mouth daily. Patient not taking: Reported on 06/21/2021 12/19/20   Wendie Agreste, MD  sulfamethoxazole-trimethoprim (BACTRIM DS) 800-160 MG tablet Take 1 tablet by mouth 2 (two) times daily. Patient not taking: Reported on 06/21/2021 06/17/21   Wendie Agreste, MD   Social History   Socioeconomic History   Marital status: Married    Spouse name: Not on file   Number of children: Not on file   Years of education: Not on file   Highest education level: Not on file  Occupational History   Not on file  Tobacco Use   Smoking status: Former   Smokeless tobacco: Never  Substance and Sexual Activity   Alcohol use: Yes   Drug use: Not on file   Sexual activity: Not on file  Other Topics Concern   Not on file  Social History Narrative   Not on file   Social Determinants of Health   Financial Resource Strain: Not on file  Food Insecurity: Not on file  Transportation Needs: Not on file  Physical Activity: Not on file  Stress: Not on file  Social Connections: Not on file  Intimate Partner Violence: Not on file    Review of Systems Per HPi   Objective:   Vitals:   06/27/21 1609  BP: 122/68  Pulse: 95  Resp: 16  Temp: 98.5 F (36.9 C)  TempSrc: Temporal  SpO2: 96%  Weight: 151 lb (68.5 kg)  Height: _0  (1.6 m)     Physical Exam Vitals reviewed.  Constitutional:      General: She is not in acute distress.    Appearance: Normal appearance. She is well-developed. She is not toxic-appearing or diaphoretic.  HENT:     Head: Normocephalic and atraumatic.  Eyes:     Conjunctiva/sclera: Conjunctivae normal.     Pupils: Pupils are equal, round, and reactive to light.  Neck:     Vascular: No carotid bruit.   Cardiovascular:     Rate and Rhythm: Normal rate and regular rhythm.     Heart sounds: Normal heart sounds.  Pulmonary:     Effort: Pulmonary effort is normal.     Breath sounds: Normal breath sounds.  Abdominal:     General: There is no distension.     Palpations: Abdomen is soft. There is no mass or pulsatile mass.     Tenderness: There is abdominal tenderness (epigastric). There is no right CVA tenderness, left CVA tenderness or guarding.  Musculoskeletal:     Right lower leg: No edema.     Left lower leg: No edema.  Skin:  General: Skin is warm and dry.  Neurological:     Mental Status: She is alert and oriented to person, place, and time.  Psychiatric:        Mood and Affect: Mood normal.        Behavior: Behavior normal.       Assessment & Plan:  Jenny Davidson is a 60 y.o. female . Cytomegaloviral mononucleosis with other complication - Plan: CBC with Differential/Platelet  Elevated lipase - Plan: Lipase  Transaminitis - Plan: Comprehensive metabolic panel  Initial suspected viral illness, with headache, diffuse myalgias, congestion, cough when seen in ER.  Chest heaviness, then abdominal pain.  Worse abdominal pain 1 week ago, seen by my colleague and at that time CT abdomen pelvis without apparent cause of symptoms, including normal-appearing pancreas at that time.  Testing indicates likely CMV mononucleosis versus EBV mononucleosis with cross-reactivity.  Likely associated transaminitis with mild elevated lipase.  Abdominal pain still present but improved.  Previous CT without signs of pancreatitis and again pain has improved.  Still likely viral syndrome, still with low-grade fevers, some nausea but no recent vomiting as above.  Has had some slow but slight improvement.  Tolerating p.o. fluids, some soup, other solids as above but still some decreased intake.  -Decided on continued symptomatic care with fluids, rest, slow advance of diet as tolerated, but nutritional  supplements such as Ensure or boost may be helpful temporarily.  Repeat labs for lipase, LFTs, CBC in 24 hours.  ER precautions given if any worsening symptoms.  Due to persistent fatigue, will continue out of work, leave paperwork to be completed.  No orders of the defined types were placed in this encounter.  Patient Instructions  Repeat labs tomorrow morning.  Try Boost or Ensure for calories, continue to drink fluids.  Depending on labs tomorrow, may discuss other workup or testing, possibly discussion with infectious disease.  Return to the clinic or go to the nearest emergency room if any of your symptoms worsen or new symptoms occur.     Signed,   Merri Ray, MD [place] 06/27/21 4:36 PM

## 2021-06-28 ENCOUNTER — Other Ambulatory Visit (INDEPENDENT_AMBULATORY_CARE_PROVIDER_SITE_OTHER): Payer: 59

## 2021-06-28 DIAGNOSIS — B2719 Cytomegaloviral mononucleosis with other complication: Secondary | ICD-10-CM | POA: Diagnosis not present

## 2021-06-28 DIAGNOSIS — R748 Abnormal levels of other serum enzymes: Secondary | ICD-10-CM | POA: Diagnosis not present

## 2021-06-28 DIAGNOSIS — R7401 Elevation of levels of liver transaminase levels: Secondary | ICD-10-CM

## 2021-06-28 LAB — CBC WITH DIFFERENTIAL/PLATELET
Basophils Absolute: 0.1 10*3/uL (ref 0.0–0.1)
Basophils Relative: 0.5 % (ref 0.0–3.0)
Eosinophils Absolute: 0.2 10*3/uL (ref 0.0–0.7)
Eosinophils Relative: 1.4 % (ref 0.0–5.0)
HCT: 40.9 % (ref 36.0–46.0)
Hemoglobin: 13.3 g/dL (ref 12.0–15.0)
Lymphocytes Relative: 69.2 % — ABNORMAL HIGH (ref 12.0–46.0)
Lymphs Abs: 8.6 10*3/uL — ABNORMAL HIGH (ref 0.7–4.0)
MCHC: 32.4 g/dL (ref 30.0–36.0)
MCV: 81.2 fl (ref 78.0–100.0)
Monocytes Absolute: 0.8 10*3/uL (ref 0.1–1.0)
Monocytes Relative: 6.4 % (ref 3.0–12.0)
Neutro Abs: 2.8 10*3/uL (ref 1.4–7.7)
Neutrophils Relative %: 22.5 % — ABNORMAL LOW (ref 43.0–77.0)
Platelets: 291 10*3/uL (ref 150.0–400.0)
RBC: 5.04 Mil/uL (ref 3.87–5.11)
RDW: 14.2 % (ref 11.5–15.5)
WBC: 12.4 10*3/uL — ABNORMAL HIGH (ref 4.0–10.5)

## 2021-06-28 LAB — COMPREHENSIVE METABOLIC PANEL
ALT: 119 U/L — ABNORMAL HIGH (ref 0–35)
AST: 92 U/L — ABNORMAL HIGH (ref 0–37)
Albumin: 3.6 g/dL (ref 3.5–5.2)
Alkaline Phosphatase: 179 U/L — ABNORMAL HIGH (ref 39–117)
BUN: 10 mg/dL (ref 6–23)
CO2: 25 mEq/L (ref 19–32)
Calcium: 8.4 mg/dL (ref 8.4–10.5)
Chloride: 106 mEq/L (ref 96–112)
Creatinine, Ser: 0.65 mg/dL (ref 0.40–1.20)
GFR: 94.24 mL/min (ref >60.00)
Glucose, Bld: 104 mg/dL — ABNORMAL HIGH (ref 70–99)
Potassium: 3.6 mEq/L (ref 3.5–5.1)
Sodium: 140 mEq/L (ref 135–145)
Total Bilirubin: 0.4 mg/dL (ref 0.2–1.2)
Total Protein: 6.1 g/dL (ref 6.0–8.3)

## 2021-06-28 LAB — LIPASE: Lipase: 73 U/L — ABNORMAL HIGH (ref 11.0–59.0)

## 2021-06-29 ENCOUNTER — Encounter: Payer: Self-pay | Admitting: Family Medicine

## 2021-07-01 ENCOUNTER — Telehealth: Payer: Self-pay | Admitting: Family Medicine

## 2021-07-01 ENCOUNTER — Other Ambulatory Visit: Payer: Self-pay | Admitting: Family Medicine

## 2021-07-01 ENCOUNTER — Ambulatory Visit: Payer: 59

## 2021-07-01 DIAGNOSIS — R7401 Elevation of levels of liver transaminase levels: Secondary | ICD-10-CM

## 2021-07-01 DIAGNOSIS — B2719 Cytomegaloviral mononucleosis with other complication: Secondary | ICD-10-CM

## 2021-07-01 DIAGNOSIS — R748 Abnormal levels of other serum enzymes: Secondary | ICD-10-CM

## 2021-07-01 NOTE — Telephone Encounter (Signed)
Called pt - no fevers, tmax 99.1, no n/v. Abd pain is improving. Eating better. Plan on labs tomorrow am. Improving, less fatigue but still not back to normal.   Labs tomorrow, then RTW to be determined.

## 2021-07-02 ENCOUNTER — Other Ambulatory Visit (INDEPENDENT_AMBULATORY_CARE_PROVIDER_SITE_OTHER): Payer: 59

## 2021-07-02 DIAGNOSIS — R7401 Elevation of levels of liver transaminase levels: Secondary | ICD-10-CM

## 2021-07-02 DIAGNOSIS — R748 Abnormal levels of other serum enzymes: Secondary | ICD-10-CM | POA: Diagnosis not present

## 2021-07-02 DIAGNOSIS — B2719 Cytomegaloviral mononucleosis with other complication: Secondary | ICD-10-CM | POA: Diagnosis not present

## 2021-07-02 LAB — CBC WITH DIFFERENTIAL/PLATELET
Basophils Absolute: 0.1 10*3/uL (ref 0.0–0.1)
Basophils Relative: 0.7 % (ref 0.0–3.0)
Eosinophils Absolute: 0.1 10*3/uL (ref 0.0–0.7)
Eosinophils Relative: 1.9 % (ref 0.0–5.0)
HCT: 40.9 % (ref 36.0–46.0)
Hemoglobin: 13.3 g/dL (ref 12.0–15.0)
Lymphocytes Relative: 61.4 % — ABNORMAL HIGH (ref 12.0–46.0)
Lymphs Abs: 4.6 10*3/uL — ABNORMAL HIGH (ref 0.7–4.0)
MCHC: 32.6 g/dL (ref 30.0–36.0)
MCV: 82.1 fl (ref 78.0–100.0)
Monocytes Absolute: 0.7 10*3/uL (ref 0.1–1.0)
Monocytes Relative: 9.7 % (ref 3.0–12.0)
Neutro Abs: 1.9 10*3/uL (ref 1.4–7.7)
Neutrophils Relative %: 26.3 % — ABNORMAL LOW (ref 43.0–77.0)
Platelets: 312 10*3/uL (ref 150.0–400.0)
RBC: 4.98 Mil/uL (ref 3.87–5.11)
RDW: 14.6 % (ref 11.5–15.5)
WBC: 7.4 10*3/uL (ref 4.0–10.5)

## 2021-07-02 LAB — COMPREHENSIVE METABOLIC PANEL
ALT: 79 U/L — ABNORMAL HIGH (ref 0–35)
AST: 58 U/L — ABNORMAL HIGH (ref 0–37)
Albumin: 3.9 g/dL (ref 3.5–5.2)
Alkaline Phosphatase: 162 U/L — ABNORMAL HIGH (ref 39–117)
BUN: 8 mg/dL (ref 6–23)
CO2: 24 mEq/L (ref 19–32)
Calcium: 8.5 mg/dL (ref 8.4–10.5)
Chloride: 106 mEq/L (ref 96–112)
Creatinine, Ser: 0.63 mg/dL (ref 0.40–1.20)
GFR: 94.94 mL/min (ref >60.00)
Glucose, Bld: 89 mg/dL (ref 70–99)
Potassium: 4.3 mEq/L (ref 3.5–5.1)
Sodium: 139 mEq/L (ref 135–145)
Total Bilirubin: 0.5 mg/dL (ref 0.2–1.2)
Total Protein: 6.5 g/dL (ref 6.0–8.3)

## 2021-07-02 LAB — LIPASE: Lipase: 88 U/L — ABNORMAL HIGH (ref 11.0–59.0)

## 2021-07-09 ENCOUNTER — Telehealth: Payer: Self-pay

## 2021-07-09 NOTE — Telephone Encounter (Signed)
Caller name:Sunya Boys  On DPR? 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

## 2021-07-10 ENCOUNTER — Encounter: Payer: Self-pay | Admitting: Family Medicine

## 2021-07-23 ENCOUNTER — Other Ambulatory Visit: Payer: Self-pay

## 2021-07-23 ENCOUNTER — Ambulatory Visit
Admission: RE | Admit: 2021-07-23 | Discharge: 2021-07-23 | Disposition: A | Discharge status: Home / Self Care | Payer: 59 | Source: Ambulatory Visit | Attending: Family Medicine | Admitting: Family Medicine

## 2021-07-23 DIAGNOSIS — Z1231 Encounter for screening mammogram for malignant neoplasm of breast: Secondary | ICD-10-CM

## 2021-07-24 ENCOUNTER — Telehealth (INDEPENDENT_AMBULATORY_CARE_PROVIDER_SITE_OTHER): Payer: 59 | Admitting: Family Medicine

## 2021-07-24 ENCOUNTER — Encounter: Payer: Self-pay | Admitting: Family Medicine

## 2021-07-24 VITALS — Temp 101.4°F

## 2021-07-24 DIAGNOSIS — U071 COVID-19: Secondary | ICD-10-CM | POA: Diagnosis not present

## 2021-07-24 MED ORDER — NIRMATRELVIR/RITONAVIR (PAXLOVID)TABLET: 3.0000 | ORAL_TABLET | Freq: Two times a day (BID) | ORAL | 0 refills | Status: AC

## 2021-07-24 MED ORDER — BENZONATATE 200 MG PO CAPS: 200.0000 mg | ORAL_CAPSULE | Freq: Three times a day (TID) | ORAL | 0 refills | Status: DC | PRN

## 2021-07-24 NOTE — Patient Instructions (Addendum)
Paxlovid is antiviral - sent to your pharmacy. Decrease amlodpine to half pill and monitor blood pressure when taking antiviral. If increases, return to full pill.  Today is day of your infection. You should isolate through day 5. If feeling better at day 6, can possibly return to work or around others with N95 or well fitting mask  - see below.  Continue mucinex, tylenol for body aches or fever. Tessalon if needed for cough. Drink plenty of fluids, healthy food as much as possible, and rest.  Be seen if shortness of breath at rest, chest pain, confusion or acute worsening.   Follow up with me in 10 days for repeat labs from prior infection and recheck.   [email or web address]   Your employer may have specific requirements for return to work, but this information is provided from the CDC. There is a link provided below for more information if needed.   Everyone who has presumed or confirmed COVID-19 should stay home and isolate from other people for at least 5 full days (day 0 is the first day of symptoms or the date of the day of the positive viral test for asymptomatic persons). You can end isolation after 5 full days if you are fever-free for 24 hours without the use of fever-reducing medication and your other symptoms have improved (Loss of taste and smell may persist for weeks or months after recovery and need not delay the end of isolation). You should continue to wear a well-fitting mask around others at home and in public for 5 additional days (day 6 through day 10) after the end of your 5-day isolation period. If you are unable to wear a mask when around others, you should continue to isolate for a full 10 days. Avoid people who have weakened immune systems or are more likely to get very sick from COVID-19, and nursing homes and other high-risk settings, until after at least 10 days.  If you continue to have fever or your other symptoms have not  improved after 5 days of isolation, you should wait to end your isolation until you are fever-free for 24 hours without the use of fever-reducing medication and your other symptoms have improved. Continue to wear a well-fitting mask through day 10.   [email or web address]

## 2021-07-24 NOTE — Progress Notes (Signed)
Virtual Visit via Video Note  I connected with Jenny Davidson on 07/24/21 at 11:30 AM by a video enabled telemedicine application and verified that I am speaking with the correct person using two identifiers.  Patient location:home - by self My location: office - [place]   I discussed the limitations, risks, security and privacy concerns of performing an evaluation and management service by telephone and the availability of in person appointments. I also discussed with the patient that there may be a patient responsible charge related to this service. The patient expressed understanding and agreed to proceed, consent obtained  Chief complaint: Chief Complaint  Patient presents with   Covid Positive    Pt reports positive this morning, sxs started yesterday morning, sinus drainage, cough, headache, fever 101.3       History of Present Illness: Jenny Davidson is a 60 y.o. female  COVID-19 infection: Initial symptoms started yesterday early morning with cough, sinus drainage, headache.  Went to work, no fever -  fever of 101.3 last night. Stayed home today. Positive test this morning, rapid test at home x2 -both  Unfortunately she was recently ill with mononucleosis, CMV versus EBV.  Had been improving.back at work since 11/28. Had been feeling better, but not back to 100%.  Sick contacts - few coworkers recently.  Denies dyspnea at rest, new confusion, or difficulty tolerating p.o. fluids. No chest pains.  Tx: mucinex. Cold eeze.   COVID-19 vaccination in April, March 2021, booster November 2021, most recent booster in April of this year.  COVID risk of complication score of 2.  No current statin.  GFR 94.94 on 07/02/2021.    Patient Active Problem List   Diagnosis Date Noted   Seasonal allergies 12/09/2013   Osteopenia 12/09/2013   Past Medical History:  Diagnosis Date   Allergy    Arthritis    Osteoporosis    Past Surgical History:  Procedure Laterality Date    ABDOMINAL HYSTERECTOMY     BREAST EXCISIONAL BIOPSY Left    2008 tumor removed - benign   BREAST SURGERY     TUBAL LIGATION     tumore removed  08/11/2006   Allergies  Allergen Reactions   Erythromycin Nausea And Vomiting   Iodine Rash    Pt states this is topical betadine   Prior to Admission medications   Medication Sig Start Date End Date Taking? Authorizing Provider  amLODipine (NORVASC) 5 MG tablet TAKE 1 TABLET(5 MG) BY MOUTH DAILY 04/22/21  Yes Shade Flood, MD  fexofenadine (ALLEGRA) 60 MG tablet Take 60 mg by mouth 2 (two) times daily.   Yes [provider]  FLUTICASONE FUROATE IN Inhale into the lungs.   Yes [provider]  hydrOXYzine (ATARAX/VISTARIL) 25 MG tablet Take 0.5-1 tablets (12.5-25 mg total) by mouth at bedtime as needed. 12/19/20  Yes Shade Flood, MD  ibuprofen (ADVIL) 800 MG tablet Take 1 tablet (800 mg total) by mouth every 8 (eight) hours as needed (For fever or pain). 06/13/21  Yes Molpus, John, MD  ondansetron (ZOFRAN ODT) 4 MG disintegrating tablet Take 1 tablet (4 mg total) by mouth every 8 (eight) hours as needed for nausea or vomiting. 06/21/21  Yes Sheliah Hatch, MD  FLUoxetine (PROZAC) 20 MG tablet Take 2 tablets (40 mg total) by mouth daily. Patient not taking: Reported on 06/21/2021 12/19/20   Shade Flood, MD  sulfamethoxazole-trimethoprim (BACTRIM DS) 800-160 MG tablet Take 1 tablet by mouth 2 (two) times daily.  Patient not taking: Reported on 06/21/2021 06/17/21   Shade Flood, MD   Social History   Socioeconomic History   Marital status: Married    Spouse name: Not on file   Number of children: Not on file   Years of education: Not on file   Highest education level: Not on file  Occupational History   Not on file  Tobacco Use   Smoking status: Former   Smokeless tobacco: Never  Substance and Sexual Activity   Alcohol use: Yes   Drug use: Not on file   Sexual activity: Not on file  Other Topics  Concern   Not on file  Social History Narrative   Not on file   Social Determinants of Health   Financial Resource Strain: Not on file  Food Insecurity: Not on file  Transportation Needs: Not on file  Physical Activity: Not on file  Stress: Not on file  Social Connections: Not on file  Intimate Partner Violence: Not on file    Observations/Objective: Vitals:   07/24/21 1046  Temp: (!) 101.4 F (38.6 C)  Nontoxic appearance on video.  Speaking in full sentences, no respiratory distress.  All questions were answered with understanding plan expressed.  Euthymic mood.     Assessment and Plan: COVID-19 virus infection - Plan: nirmatrelvir/ritonavir EUA (PAXLOVID) 20 x 150 MG & 10 x 100MG  TABS, benzonatate (TESSALON) 200 MG capsule COVID-19 infection, no red flags on exam or history.  Has received vaccination and 2 boosters.  Antivirals discussed, she elected to take antiviral.  Potential side effects and risk discussed including possibility of rebound COVID and management.  Symptomatic care discussed for infection.  Decrease amlodipine by half while taking antiviral and close monitoring of blood pressure.  Tessalon Perles for cough.  Isolation and masking precautions discussed per CDC recommendations and handout given.  ER precautions given.  Follow Up Instructions: 10 days for recheck of prior illness, repeat labs and follow-up from COVID infection.  ER precautions given.   I discussed the assessment and treatment plan with the patient. The patient was provided an opportunity to ask questions and all were answered. The patient agreed with the plan and demonstrated an understanding of the instructions.   The patient was advised to call back or seek an in-person evaluation if the symptoms worsen or if the condition fails to improve as anticipated.   , MD

## 2021-07-25 ENCOUNTER — Telehealth: Payer: 59 | Admitting: Family Medicine

## 2021-11-28 ENCOUNTER — Ambulatory Visit: Payer: 59 | Admitting: Family Medicine

## 2022-04-06 ENCOUNTER — Telehealth: Payer: 59 | Admitting: Nurse Practitioner

## 2022-04-06 DIAGNOSIS — F419 Anxiety disorder, unspecified: Secondary | ICD-10-CM | POA: Diagnosis not present

## 2022-04-06 DIAGNOSIS — R4589 Other symptoms and signs involving emotional state: Secondary | ICD-10-CM | POA: Diagnosis not present

## 2022-04-06 NOTE — Progress Notes (Signed)
Virtual Visit Consent   Jenny Davidson, you are scheduled for a virtual visit with a [facility] provider today. Just as with appointments in the office, your consent must be obtained to participate. Your consent will be active for this visit and any virtual visit you may have with one of our providers in the next 365 days. If you have a MyChart account, a copy of this consent can be sent to you electronically.  As this is a virtual visit, video technology does not allow for your provider to perform a traditional examination. This may limit your provider's ability to fully assess your condition. If your provider identifies any concerns that need to be evaluated in person or the need to arrange testing (such as labs, EKG, etc.), we will make arrangements to do so. Although advances in technology are sophisticated, we cannot ensure that it will always work on either your end or our end. If the connection with a video visit is poor, the visit may have to be switched to a telephone visit. With either a video or telephone visit, we are not always able to ensure that we have a secure connection.  By engaging in this virtual visit, you consent to the provision of healthcare and authorize for your insurance to be billed (if applicable) for the services provided during this visit. Depending on your insurance coverage, you may receive a charge related to this service.  I need to obtain your verbal consent now. Are you willing to proceed with your visit today? Jenny Davidson has provided verbal consent on 04/06/2022 for a virtual visit (video or telephone). Abran Cantor, NP  Date: 04/06/2022 1:30 PM  Virtual Visit via Video Note   I, Jenny Davidson, connected with  Jenny Davidson  ([id number], Sep 10, 1960) on 04/06/22 at 12:45 PM EDT by a video-enabled telemedicine application and verified that I am speaking with the correct person using two identifiers.  Location: Patient: Virtual Visit  Location Patient: Home Provider: Virtual Visit Location Provider: Home   I discussed the limitations of evaluation and management by telemedicine and the availability of in person appointments. The patient expressed understanding and agreed to proceed.    History of Present Illness: Jenny Davidson is a 61 y.o. who identifies as a female who was assigned female at birth, and is being seen today for symptoms of anxiety and depression. Patient reports over the past several weeks, she has had increased anxiety and depression. She feels this could be related to her work environment and because she recently fractured her left wrist/forearm. The fracture occurred first, which caused issues with her job. She states her manager is new and feels like a "bully". The patient reports she cannot find another job and needs her health insurance and to be able to work. Patient currently works at [employer] and has been there for 12 years. Reports she talked with her Agricultural consultant regarding the manager's behavior and was subsequently written up. Patient states this was her first write up in 12 years. Patient reports feeling "weak", decreased appetite and feeling "unwell" as she knows she has to go to work tomorrow. Patient also reports feeling "useless" at times. Patient has been released to rtw, but states her manager has given her a hard time. Patient denies SI/HI, but reports sometimes she wishes she wouldn't wake up. She has her husband who is a good support system for her. Patient was prescribed Prozac in the past, but stopped medication due  t side effects. She has scheduled an appointment with her PCP but appointment is not for another few weeks. She has never seen a therapist.  HPI: HPI  Problems:  Patient Active Problem List   Diagnosis Date Noted   Seasonal allergies 12/09/2013   Osteopenia 12/09/2013    Allergies:  Allergies  Allergen Reactions   Erythromycin Nausea And Vomiting   Iodine Rash    Pt  states this is topical betadine   Medications:  Current Outpatient Medications:    amLODipine (NORVASC) 5 MG tablet, TAKE 1 TABLET(5 MG) BY MOUTH DAILY, Disp: 90 tablet, Rfl: 1   benzonatate (TESSALON) 200 MG capsule, Take 1 capsule (200 mg total) by mouth 3 (three) times daily as needed for cough., Disp: 20 capsule, Rfl: 0   fexofenadine (ALLEGRA) 60 MG tablet, Take 60 mg by mouth 2 (two) times daily., Disp: , Rfl:    FLUoxetine (PROZAC) 20 MG tablet, Take 2 tablets (40 mg total) by mouth daily. (Patient not taking: Reported on 06/21/2021), Disp: 180 tablet, Rfl: 1   FLUTICASONE FUROATE IN, Inhale into the lungs., Disp: , Rfl:    hydrOXYzine (ATARAX/VISTARIL) 25 MG tablet, Take 0.5-1 tablets (12.5-25 mg total) by mouth at bedtime as needed., Disp: 30 tablet, Rfl: 1   ibuprofen (ADVIL) 800 MG tablet, Take 1 tablet (800 mg total) by mouth every 8 (eight) hours as needed (For fever or pain)., Disp: 21 tablet, Rfl: 0   ondansetron (ZOFRAN ODT) 4 MG disintegrating tablet, Take 1 tablet (4 mg total) by mouth every 8 (eight) hours as needed for nausea or vomiting., Disp: 20 tablet, Rfl: 0   sulfamethoxazole-trimethoprim (BACTRIM DS) 800-160 MG tablet, Take 1 tablet by mouth 2 (two) times daily. (Patient not taking: Reported on 06/21/2021), Disp: 14 tablet, Rfl: 0  Observations/Objective: Patient is well-developed, well-nourished in no acute distress, periods of tearfulness.  Resting comfortably at home.  Head is normocephalic, atraumatic.  No labored breathing.  Speech is clear and coherent with logical content.  Patient is alert and oriented at baseline.    Assessment and Plan: 1. Anxiety-like symptoms - Ambulatory referral to Psychiatry  2. Symptoms of depression - Ambulatory referral to Psychiatry  1, 2. Recommend follow-up with PCP as soon as possible. Advised patient to contact office on 04/07/22 to see if she can be worked in. Also recommended escalating issues on her job to her DM and  began taking notes of the incidences that are occurring. Patient was advised to consider a therapist and could be referred by her PCP. Referral was made for the patient today. Patient advised to talk with spouse regarding current job situation, find something that she enjoys doing. Patient advised to go to ER immediately if she develops feelings of SI/HI. Patient was provided work note. Patient verbalizes understanding, all questions were answered.   Follow Up Instructions: I discussed the assessment and treatment plan with the patient. The patient was provided an opportunity to ask questions and all were answered. The patient agreed with the plan and demonstrated an understanding of the instructions.  A copy of instructions were sent to the patient via MyChart unless otherwise noted below.   The patient was advised to call back or seek an in-person evaluation if the symptoms worsen or if the condition fails to improve as anticipated.  Time:  I spent 20 minutes with the patient via telehealth technology discussing the above problems/concerns.    Abran Cantor, NP

## 2022-04-06 NOTE — Patient Instructions (Addendum)
  Jenny Davidson, thank you for joining Abran Cantor, NP for today's virtual visit.  While this provider is not your primary care provider (PCP), if your PCP is located in our provider database this encounter information will be shared with them immediately following your visit.  Consent: (Patient) Jenny Davidson provided verbal consent for this virtual visit at the beginning of the encounter.  Current Medications:  Current Outpatient Medications:    amLODipine (NORVASC) 5 MG tablet, TAKE 1 TABLET(5 MG) BY MOUTH DAILY, Disp: 90 tablet, Rfl: 1   benzonatate (TESSALON) 200 MG capsule, Take 1 capsule (200 mg total) by mouth 3 (three) times daily as needed for cough., Disp: 20 capsule, Rfl: 0   fexofenadine (ALLEGRA) 60 MG tablet, Take 60 mg by mouth 2 (two) times daily., Disp: , Rfl:    FLUoxetine (PROZAC) 20 MG tablet, Take 2 tablets (40 mg total) by mouth daily. (Patient not taking: Reported on 06/21/2021), Disp: 180 tablet, Rfl: 1   FLUTICASONE FUROATE IN, Inhale into the lungs., Disp: , Rfl:    hydrOXYzine (ATARAX/VISTARIL) 25 MG tablet, Take 0.5-1 tablets (12.5-25 mg total) by mouth at bedtime as needed., Disp: 30 tablet, Rfl: 1   ibuprofen (ADVIL) 800 MG tablet, Take 1 tablet (800 mg total) by mouth every 8 (eight) hours as needed (For fever or pain)., Disp: 21 tablet, Rfl: 0   ondansetron (ZOFRAN ODT) 4 MG disintegrating tablet, Take 1 tablet (4 mg total) by mouth every 8 (eight) hours as needed for nausea or vomiting., Disp: 20 tablet, Rfl: 0   sulfamethoxazole-trimethoprim (BACTRIM DS) 800-160 MG tablet, Take 1 tablet by mouth 2 (two) times daily. (Patient not taking: Reported on 06/21/2021), Disp: 14 tablet, Rfl: 0   Medications ordered in this encounter:  No orders of the defined types were placed in this encounter.    *If you need refills on other medications prior to your next appointment, please contact your pharmacy*  Follow-Up: Call back or seek an in-person  evaluation if the symptoms worsen or if the condition fails to improve as anticipated.  Other Instructions Contact primary care physician office on 04/07/22 to see if you can be worked in. Also recommended escalating issues on your job to the DM and began taking notes of the incidences that are occurring. Consider a therapist. Referral for psychiatry was made for you today. You should be contacted within the week. Talk with spouse regarding current job situation, find something that she enjoys doing. Go to ER immediately if you develop feelings of hurting yourself or someone else.   If you have been instructed to have an in-person evaluation today at a local Urgent Care facility, please use the link below. It will take you to a list of all of our available [facility], including address, phone number and hours of operation. Please do not delay care.  [facility]  If you or a family member do not have a primary care provider, use the link below to schedule a visit and establish care. When you choose a [facility] primary care physician or advanced practice provider, you gain a long-term partner in health. Find a Primary Care Provider  Learn more about [facility]'s in-office and virtual care options:  - Get Care Now

## 2022-04-11 DIAGNOSIS — R109 Unspecified abdominal pain: Secondary | ICD-10-CM | POA: Insufficient documentation

## 2022-04-18 ENCOUNTER — Ambulatory Visit: Payer: 59 | Admitting: Family Medicine

## 2022-04-21 ENCOUNTER — Ambulatory Visit (INDEPENDENT_AMBULATORY_CARE_PROVIDER_SITE_OTHER): Payer: 59 | Admitting: Family Medicine

## 2022-04-21 ENCOUNTER — Encounter: Payer: Self-pay | Admitting: Family Medicine

## 2022-04-21 VITALS — BP 132/80 | HR 79 | Temp 98.2°F | Ht 63.0 in | Wt 130.6 lb

## 2022-04-21 DIAGNOSIS — R1013 Epigastric pain: Secondary | ICD-10-CM | POA: Diagnosis not present

## 2022-04-21 DIAGNOSIS — R112 Nausea with vomiting, unspecified: Secondary | ICD-10-CM | POA: Diagnosis not present

## 2022-04-21 DIAGNOSIS — R197 Diarrhea, unspecified: Secondary | ICD-10-CM

## 2022-04-21 DIAGNOSIS — R350 Frequency of micturition: Secondary | ICD-10-CM

## 2022-04-21 DIAGNOSIS — F418 Other specified anxiety disorders: Secondary | ICD-10-CM | POA: Diagnosis not present

## 2022-04-21 DIAGNOSIS — F439 Reaction to severe stress, unspecified: Secondary | ICD-10-CM

## 2022-04-21 DIAGNOSIS — R630 Anorexia: Secondary | ICD-10-CM | POA: Diagnosis not present

## 2022-04-21 LAB — COMPREHENSIVE METABOLIC PANEL
ALT: 13 U/L (ref 0–35)
AST: 21 U/L (ref 0–37)
Albumin: 4.6 g/dL (ref 3.5–5.2)
Alkaline Phosphatase: 69 U/L (ref 39–117)
BUN: 9 mg/dL (ref 6–23)
CO2: 28 mEq/L (ref 19–32)
Calcium: 10.1 mg/dL (ref 8.4–10.5)
Chloride: 103 mEq/L (ref 96–112)
Creatinine, Ser: 0.73 mg/dL (ref 0.40–1.20)
GFR: 87.52 mL/min (ref >60.00)
Glucose, Bld: 90 mg/dL (ref 70–99)
Potassium: 3.6 mEq/L (ref 3.5–5.1)
Sodium: 142 mEq/L (ref 135–145)
Total Bilirubin: 0.6 mg/dL (ref 0.2–1.2)
Total Protein: 7.7 g/dL (ref 6.0–8.3)

## 2022-04-21 LAB — URINALYSIS, MICROSCOPIC ONLY: RBC / HPF: NONE SEEN (ref 0–?)

## 2022-04-21 LAB — CBC
HCT: 45.7 % (ref 36.0–46.0)
Hemoglobin: 15.4 g/dL — ABNORMAL HIGH (ref 12.0–15.0)
MCHC: 33.7 g/dL (ref 30.0–36.0)
MCV: 83 fl (ref 78.0–100.0)
Platelets: 300 10*3/uL (ref 150.0–400.0)
RBC: 5.5 Mil/uL — ABNORMAL HIGH (ref 3.87–5.11)
RDW: 14 % (ref 11.5–15.5)
WBC: 9 10*3/uL (ref 4.0–10.5)

## 2022-04-21 LAB — LIPASE: Lipase: 60 U/L — ABNORMAL HIGH (ref 11.0–59.0)

## 2022-04-21 LAB — POCT URINALYSIS DIP (MANUAL ENTRY)
Bilirubin, UA: NEGATIVE
Blood, UA: NEGATIVE
Glucose, UA: NEGATIVE mg/dL
Ketones, POC UA: NEGATIVE mg/dL
Nitrite, UA: NEGATIVE
Protein Ur, POC: NEGATIVE mg/dL
Spec Grav, UA: 1.025 (ref 1.010–1.025)
Urobilinogen, UA: NEGATIVE E.U./dL — AB
pH, UA: 6 (ref 5.0–8.0)

## 2022-04-21 MED ORDER — SERTRALINE HCL 50 MG PO TABS: 50.0000 mg | ORAL_TABLET | Freq: Every day | ORAL | 1 refills | Status: DC

## 2022-04-21 MED ORDER — ONDANSETRON HCL 4 MG PO TABS: 4.0000 mg | ORAL_TABLET | Freq: Three times a day (TID) | ORAL | 0 refills | Status: AC | PRN

## 2022-04-21 MED ORDER — OMEPRAZOLE 20 MG PO CPDR: 20.0000 mg | DELAYED_RELEASE_CAPSULE | Freq: Every day | ORAL | 3 refills | Status: DC

## 2022-04-21 NOTE — Patient Instructions (Addendum)
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  Start zoloft once per day and call therapist for appointment. [id number] hotline is an option if needed.   Here are a few options for counseling:  [facility]:  [place] [phone]  Criss Rosales foods for now, zofran if needed for nausea.  Start omeprazole for abdominal discomfort.  If that worsens be seen here, urgent care or ER.  If any dark stools or blood in the stools be seen right away. I will also refer you to gastroenterology to discuss the abdominal further.  Recheck with me in the next 2 weeks.    Return to the clinic or go to the nearest emergency room if any of your symptoms worsen or new symptoms occur.  Abdominal Pain, Adult Pain in the abdomen (abdominal pain) can be caused by many things. Often, abdominal pain is not serious and it gets better with no treatment or by being treated at home. However, sometimes abdominal pain is serious. Your health care provider will ask questions about your medical history and do a physical exam to try to determine the cause of your abdominal pain. Follow these instructions at home: Medicines Take over-the-counter and prescription medicines only as told by your health care provider. Do not take a laxative unless told by your health care provider. General instructions  Watch your condition for any changes. Drink enough fluid to keep your urine pale yellow. Keep all follow-up visits as told by your health care provider. This is important. Contact a health care provider if: Your abdominal pain changes or gets worse. You are not hungry or you lose weight without trying. You are constipated or have diarrhea for more than 2-3 days. You have pain when you urinate or have a bowel movement. Your abdominal pain wakes you up at night. Your pain gets worse with meals, after eating, or with certain foods. You are vomiting and  cannot keep anything down. You have a fever. You have blood in your urine. Get help right away if: Your pain does not go away as soon as your health care provider told you to expect. You cannot stop vomiting. Your pain is only in areas of the abdomen, such as the right side or the left lower portion of the abdomen. Pain on the right side could be caused by appendicitis. You have bloody or black stools, or stools that look like tar. You have severe pain, cramping, or bloating in your abdomen. You have signs of dehydration, such as: Dark urine, very little urine, or no urine. Cracked lips. Dry mouth. Sunken eyes. Sleepiness. Weakness. You have trouble breathing or chest pain. Summary Often, abdominal pain is not serious and it gets better with no treatment or by being treated at home. However, sometimes abdominal pain is serious. Watch your condition for any changes. Take over-the-counter and prescription medicines only as told by your health care provider. Contact a health care provider if your abdominal pain changes or gets worse. Get help right away if you have severe pain, cramping, or bloating in your abdomen. This information is not intended to replace advice given to you by your health care provider. Make sure you discuss any questions you have with your health care provider. Document Revised: 09/16/2019 Document Reviewed: 12/06/2018 Elsevier Patient Education  [place], Adult After being diagnosed with anxiety, you may be relieved to know why you have felt or behaved a certain way. You may  also feel overwhelmed about the treatment ahead and what it will mean for your life. With care and support, you can manage this condition. How to manage lifestyle changes Managing stress and anxiety  Stress is your body's reaction to life changes and events, both good and bad. Most stress will last just a few hours, but stress can be ongoing and can lead to more  than just stress. Although stress can play a major role in anxiety, it is not the same as anxiety. Stress is usually caused by something external, such as a deadline, test, or competition. Stress normally passes after the triggering event has ended.  Anxiety is caused by something internal, such as imagining a terrible outcome or worrying that something will go wrong that will devastate you. Anxiety often does not go away even after the triggering event is over, and it can become long-term (chronic) worry. It is important to understand the differences between stress and anxiety and to manage your stress effectively so that it does not lead to an anxious response. Talk with your health care provider or a counselor to learn more about reducing anxiety and stress. He or she may suggest tension reduction techniques, such as: Music therapy. Spend time creating or listening to music that you enjoy and that inspires you. Mindfulness-based meditation. Practice being aware of your normal breaths while not trying to control your breathing. It can be done while sitting or walking. Centering prayer. This involves focusing on a word, phrase, or sacred image that means something to you and brings you peace. Deep breathing. To do this, expand your stomach and inhale slowly through your nose. Hold your breath for 3-5 seconds. Then exhale slowly, letting your stomach muscles relax. Self-talk. Learn to notice and identify thought patterns that lead to anxiety reactions and change those patterns to thoughts that feel peaceful. Muscle relaxation. Taking time to tense muscles and then relax them. Choose a tension reduction technique that fits your lifestyle and personality. These techniques take time and practice. Set aside 5-15 minutes a day to do them. Therapists can offer counseling and training in these techniques. The training to help with anxiety may be covered by some insurance plans. Other things you can do to manage  stress and anxiety include: Keeping a stress diary. This can help you learn what triggers your reaction and then learn ways to manage your response. Thinking about how you react to certain situations. You may not be able to control everything, but you can control your response. Making time for activities that help you relax and not feeling guilty about spending your time in this way. Doing visual imagery. This involves imagining or creating mental pictures to help you relax. Practicing yoga. Through yoga poses, you can lower tension and promote relaxation.  Medicines Medicines can help ease symptoms. Medicines for anxiety include: Antidepressant medicines. These are usually prescribed for long-term daily control. Anti-anxiety medicines. These may be added in severe cases, especially when panic attacks occur. Medicines will be prescribed by a health care provider. When used together, medicines, psychotherapy, and tension reduction techniques may be the most effective treatment. Relationships Relationships can play a big part in helping you recover. Try to spend more time connecting with trusted friends and family members. Consider going to couples counseling if you have a partner, taking family education classes, or going to family therapy. Therapy can help you and others better understand your condition. How to recognize changes in your anxiety Everyone responds differently to treatment  for anxiety. Recovery from anxiety happens when symptoms decrease and stop interfering with your daily activities at home or work. This may mean that you will start to: Have better concentration and focus. Worry will interfere less in your daily thinking. Sleep better. Be less irritable. Have more energy. Have improved memory. It is also important to recognize when your condition is getting worse. Contact your health care provider if your symptoms interfere with home or work and you feel like your condition is  not improving. Follow these instructions at home: Activity Exercise. Adults should do the following: Exercise for at least 150 minutes each week. The exercise should increase your heart rate and make you sweat (moderate-intensity exercise). Strengthening exercises at least twice a week. Get the right amount and quality of sleep. Most adults need 7-9 hours of sleep each night. Lifestyle  Eat a healthy diet that includes plenty of vegetables, fruits, whole grains, low-fat dairy products, and lean protein. Do not eat a lot of foods that are high in fats, added sugars, or salt (sodium). Make choices that simplify your life. Do not use any products that contain nicotine or tobacco. These products include cigarettes, chewing tobacco, and vaping devices, such as e-cigarettes. If you need help quitting, ask your health care provider. Avoid caffeine, alcohol, and certain over-the-counter cold medicines. These may make you feel worse. Ask your pharmacist which medicines to avoid. General instructions Take over-the-counter and prescription medicines only as told by your health care provider. Keep all follow-up visits. This is important. Where to find support You can get help and support from these sources: Self-help groups. Online and [employer]. A trusted spiritual leader. Couples counseling. Family education classes. Family therapy. Where to find more information You may find that joining a support group helps you deal with your anxiety. The following sources can help you locate counselors or support groups near you: Mental Health America: www.mentalhealthamerica.net Anxiety and Depression Association of [place] (ADAA): [employer] on Mental Illness (NAMI): www.nami.org Contact a health care provider if: You have a hard time staying focused or finishing daily tasks. You spend many hours a day feeling worried about everyday life. You become exhausted by  worry. You start to have headaches or frequently feel tense. You develop chronic nausea or diarrhea. Get help right away if: You have a racing heart and shortness of breath. You have thoughts of hurting yourself or others. If you ever feel like you may hurt yourself or others, or have thoughts about taking your own life, get help right away. Go to your nearest emergency department or: Call your local emergency services (911 in the [place] Call a suicide crisis helpline, such as the National Suicide Prevention Lifeline at 515-862-8665 or 988 in the [place] This is open 24 hours a day in the [place] Text the Crisis Text Line at 214 460 4201 (in the [place] Summary Taking steps to learn and use tension reduction techniques can help calm you and help prevent triggering an anxiety reaction. When used together, medicines, psychotherapy, and tension reduction techniques may be the most effective treatment. Family, friends, and partners can play a big part in supporting you. This information is not intended to replace advice given to you by your health care provider. Make sure you discuss any questions you have with your health care provider. Document Revised: 02/20/2021 Document Reviewed: 11/18/2020 Elsevier Patient Education  2023 Elsevier Inc.   Managing Depression, Adult Depression is a mental health condition that affects your thoughts, feelings, and actions.  Being diagnosed with depression can bring you relief if you did not know why you have felt or behaved a certain way. It could also leave you feeling overwhelmed with uncertainty about your future. Preparing yourself to manage your symptoms can help you feel more positive about your future. How to manage lifestyle changes Managing stress  Stress is your body's reaction to life changes and events, both good and bad. Stress can add to your feelings of depression. Learning to manage your stress can help lessen your feelings of depression. Try some of the  following approaches to reducing your stress (stress reduction techniques): Listen to music that you enjoy and that inspires you. Try using a meditation app or take a meditation class. Develop a practice that helps you connect with your spiritual self. Walk in nature, pray, or go to a place of worship. Do some deep breathing. To do this, inhale slowly through your nose. Pause at the top of your inhale for a few seconds and then exhale slowly, letting your muscles relax. Practice yoga to help relax and work your muscles. Choose a stress reduction technique that suits your lifestyle and personality. These techniques take time and practice to develop. Set aside 5-15 minutes a day to do them. Therapists can offer training in these techniques. Other things you can do to manage stress include: Keeping a stress diary. Knowing your limits and saying no when you think something is too much. Paying attention to how you react to certain situations. You may not be able to control everything, but you can change your reaction. Adding humor to your life by watching funny films or TV shows. Making time for activities that you enjoy and that relax you.  Medicines Medicines, such as antidepressants, are often a part of treatment for depression. Talk with your pharmacist or health care provider about all the medicines, supplements, and herbal products that you take, their possible side effects, and what medicines and other products are safe to take together. Make sure to report any side effects you may have to your health care provider. Relationships Your health care provider may suggest family therapy, couples therapy, or individual therapy as part of your treatment. How to recognize changes Everyone responds differently to treatment for depression. As you recover from depression, you may start to: Have more interest in doing activities. Feel less hopeless. Have more energy. Overeat less often, or have a  better appetite. Have better mental focus. It is important to recognize if your depression is not getting better or is getting worse. The symptoms you had in the beginning may return, such as: Tiredness (fatigue) or low energy. Eating too much or too little. Sleeping too much or too little. Feeling restless, agitated, or hopeless. Trouble focusing or making decisions. Unexplained physical complaints. Feeling irritable, angry, or aggressive. If you or your family members notice these symptoms coming back, let your health care provider know right away. Follow these instructions at home: Activity  Try to get some form of exercise each day, such as walking, biking, swimming, or lifting weights. Practice stress reduction techniques. Engage your mind by taking a class or doing some volunteer work. Lifestyle Get the right amount and quality of sleep. Cut down on using caffeine, tobacco, alcohol, and other potentially harmful substances. Eat a healthy diet that includes plenty of vegetables, fruits, whole grains, low-fat dairy products, and lean protein. Do not eat a lot of foods that are high in solid fats, added sugars, or salt (sodium). General  instructions Take over-the-counter and prescription medicines only as told by your health care provider. Keep all follow-up visits as told by your health care provider. This is important. Where to find support Talking to others  Friends and family members can be sources of support and guidance. Talk to trusted friends or family members about your condition. Explain your symptoms to them, and let them know that you are working with a health care provider to treat your depression. Tell friends and family members how they also can be helpful. Finances Find appropriate mental health providers that fit with your financial situation. Talk with your health care provider about options to get reduced prices on your medicines. Where to find more  information You can find support in your area from: Anxiety and Depression Association of America (ADAA): www.adaa.org Mental Health America: www.mentalhealthamerica.net [employer] on Mental Illness: www.nami.org Contact a health care provider if: You stop taking your antidepressant medicines, and you have any of these symptoms: Nausea. Headache. Light-headedness. Chills and body aches. Not being able to sleep (insomnia). You or your friends and family think your depression is getting worse. Get help right away if: You have thoughts of hurting yourself or others. If you ever feel like you may hurt yourself or others, or have thoughts about taking your own life, get help right away. Go to your nearest emergency department or: Call your local emergency services (911 in the [place] Call a suicide crisis helpline, such as the [facility] at (418)654-4787 or 988 in the [place]. This is open 24 hours a day in the [place] Text the Crisis Text Line at 272-184-3996 (in the [place].). Summary If you are diagnosed with depression, preparing yourself to manage your symptoms is a good way to feel positive about your future. Work with your health care provider on a management plan that includes stress reduction techniques, medicines (if applicable), therapy, and healthy lifestyle habits. Keep talking with your health care provider about how your treatment is working. If you have thoughts about taking your own life, call a suicide crisis helpline or text a crisis text line. This information is not intended to replace advice given to you by your health care provider. Make sure you discuss any questions you have with your health care provider. Document Revised: 02/20/2021 Document Reviewed: 06/08/2019 Elsevier Patient Education  [place].

## 2022-04-21 NOTE — Progress Notes (Signed)
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 provider know right away. Follow these instructions at home: Activity  Try to get some form of exercise each day, such as walking, biking, swimming, or lifting weights. Practice stress reduction techniques. Engage your mind by taking a class or doing some volunteer work. Lifestyle Get the right amount and quality of sleep. Cut down on using caffeine, tobacco, alcohol, and other potentially harmful substances. Eat a healthy diet that includes plenty of vegetables, fruits, whole grains, low-fat dairy products, and lean protein. Do not eat a lot of foods that are high in solid fats, added sugars, or salt (sodium). General instructions Take over-the-counter and prescription medicines only as told by your health care provider. Keep all follow-up  visits as told by your health care provider. This is important. Where to find support Talking to others  Friends and family members can be sources of support and guidance. Talk to trusted friends or family members about your condition. Explain your symptoms to them, and let them know that you are working with a health care provider to treat your depression. Tell friends and family members how they also can be helpful. Finances Find appropriate mental health providers that fit with your financial situation. Talk with your health care provider about options to get reduced prices on your medicines. Where to find more information You can find support in your area from: Anxiety and Depression Association of America (ADAA): www.adaa.org Mental Health America: www.mentalhealthamerica.net [employer] on Mental Illness: www.nami.org Contact a health care provider if: You stop taking your antidepressant medicines, and you have any of these symptoms: Nausea. Headache. Light-headedness. Chills and body aches. Not being able to sleep (insomnia). You or your friends and family think your depression is getting worse. Get help right away if: You have thoughts of hurting yourself or others. If you ever feel like you may hurt yourself or others, or have thoughts about taking your own life, get help right away. Go to your nearest emergency department or: Call your local emergency services (911 in the [place] Call a suicide crisis helpline, such as the [place] at 718-295-7797 or 988 in the [place]. This is open 24 hours a day in the [place] Text the Crisis Text Line at 217-853-1976 (in the [place].). Summary If you are diagnosed with depression, preparing yourself to manage your symptoms is a good way to feel positive about your future. Work with your health care provider on a management plan that includes stress reduction techniques, medicines (if applicable), therapy, and healthy  lifestyle habits. Keep talking with your health care provider about how your treatment is working. If you have thoughts about taking your own life, call a suicide crisis helpline or text a crisis text line. This information is not intended to replace advice given to you by your health care provider. Make sure you discuss any questions you have with your health care provider. Document Revised: 02/20/2021 Document Reviewed: 06/08/2019 Elsevier Patient Education  [place].  Signed,   Merri Ray, MD [place] 04/21/22 11:15 AM

## 2022-04-26 ENCOUNTER — Encounter: Payer: Self-pay | Admitting: Family Medicine

## 2022-04-29 ENCOUNTER — Encounter: Payer: Self-pay | Admitting: Family Medicine

## 2022-04-30 NOTE — Telephone Encounter (Signed)
Pt concerned about some side effects notes she is not sleeping well-waking up at 1-3 am and having trouble getting back to sleep.  Also notes -possibly related- has had some issues being constipated should she try miralax for gentle relief? Please advise

## 2022-04-30 NOTE — Telephone Encounter (Signed)
Pt noted took first dose in the morning on Wed then started taking in the evening starting Thurs   Does this change your directions if she is already taking this at night?

## 2022-04-30 NOTE — Telephone Encounter (Signed)
Prior message was intended to send directly to patient - I will send again.

## 2022-05-08 ENCOUNTER — Encounter: Payer: Self-pay | Admitting: Family Medicine

## 2022-05-08 ENCOUNTER — Ambulatory Visit (INDEPENDENT_AMBULATORY_CARE_PROVIDER_SITE_OTHER): Payer: 59 | Admitting: Family Medicine

## 2022-05-08 VITALS — BP 124/70 | HR 72 | Temp 98.1°F | Ht 63.0 in | Wt 129.0 lb

## 2022-05-08 DIAGNOSIS — R1013 Epigastric pain: Secondary | ICD-10-CM

## 2022-05-08 DIAGNOSIS — I10 Essential (primary) hypertension: Secondary | ICD-10-CM | POA: Diagnosis not present

## 2022-05-08 DIAGNOSIS — F5104 Psychophysiologic insomnia: Secondary | ICD-10-CM | POA: Diagnosis not present

## 2022-05-08 DIAGNOSIS — Z23 Encounter for immunization: Secondary | ICD-10-CM | POA: Diagnosis not present

## 2022-05-08 DIAGNOSIS — R748 Abnormal levels of other serum enzymes: Secondary | ICD-10-CM | POA: Diagnosis not present

## 2022-05-08 DIAGNOSIS — F439 Reaction to severe stress, unspecified: Secondary | ICD-10-CM | POA: Diagnosis not present

## 2022-05-08 DIAGNOSIS — F418 Other specified anxiety disorders: Secondary | ICD-10-CM

## 2022-05-08 LAB — CBC
HCT: 44.2 % (ref 36.0–46.0)
Hemoglobin: 14.7 g/dL (ref 12.0–15.0)
MCHC: 33.2 g/dL (ref 30.0–36.0)
MCV: 84 fl (ref 78.0–100.0)
Platelets: 277 10*3/uL (ref 150.0–400.0)
RBC: 5.27 Mil/uL — ABNORMAL HIGH (ref 3.87–5.11)
RDW: 14.4 % (ref 11.5–15.5)
WBC: 7.8 10*3/uL (ref 4.0–10.5)

## 2022-05-08 LAB — LIPASE: Lipase: 72 U/L — ABNORMAL HIGH (ref 11.0–59.0)

## 2022-05-08 MED ORDER — AMLODIPINE BESYLATE 5 MG PO TABS: ORAL_TABLET | ORAL | 1 refills | Status: DC

## 2022-05-08 MED ORDER — HYDROXYZINE HCL 25 MG PO TABS: 12.5000 mg | ORAL_TABLET | Freq: Every evening | ORAL | 1 refills | Status: DC | PRN

## 2022-05-08 NOTE — Progress Notes (Signed)
Subjective:  Patient ID: Jenny Davidson, female    DOB: 29-Jan-1961  Age: 61 y.o. MRN: [id number]  CC:  Chief Complaint  Patient presents with   Abdominal Pain   Depression    Pt states she cant sleep, having constipation as well PHQ9 - 18   Anxiety    GAD 7 - 18    HPI Jenny Davidson presents for   Depression/anxiety/insomnia See last office visit, situational stressors with work.  Phone numbers provided for counseling, and she had planned on discussing work concern is with individuals in her company.  Started on Zoloft 50 mg daily. Initially some less anxiety/depression. Has not been able to meet with [employer] certified [employer]. No new leads for jobs yet.  Has not been able to get appointment yet with therapist. Possibly will see Durward Mallard.  Trouble sleeping - staying asleep. Tried melatonin - gets to sleep, wakes up during night.   No recent hydroxyzine. Took in past, tolerated prior.  No recent alcohol.  Suicidal ideation - denies today. No intent or plan.     05/08/2022    9:17 AM 04/21/2022   10:58 AM 11/17/2019    4:50 PM 07/18/2019    5:07 PM  GAD 7 : Generalized Anxiety Score  Nervous, Anxious, on Edge 3 2 3 1   Control/stop worrying 3 3 3 1   Worry too much - different things 3 3 3 1   Trouble relaxing 2 2 2 2   Restless 1 1 1 1   Easily annoyed or irritable 3 0 0 1  Afraid - awful might happen 3 3 1 1   Total GAD 7 Score 18 14 13 8   Anxiety Difficulty  Extremely difficult         05/08/2022    9:15 AM 04/21/2022   10:48 AM 06/24/2021    1:25 PM 06/08/2020    8:07 AM 12/08/2019    9:06 AM  Depression screen PHQ 2/9  Decreased Interest 3 3 3  0 0  Down, Depressed, Hopeless 3 3 3  0 0  PHQ - 2 Score 6 6 6  0 0  Altered sleeping 3 3 3     Tired, decreased energy 3 3 3     Change in appetite 3 3 3     Feeling bad or failure about yourself  2 2 2     Trouble concentrating 0 0 2    Moving slowly or fidgety/restless 0 0 0    Suicidal thoughts 1 2 0    PHQ-9 Score 18 19 19      Difficult doing work/chores  Extremely dIfficult         Hypertension: Amlodipine 10mg  qd.  Home readings: BP Readings from Last 3 Encounters:  05/08/22 124/70  04/21/22 132/80  06/27/21 122/68   Lab Results  Component Value Date   CREATININE 0.73 04/21/2022    Abdominal pain Intermittent epigastric pain for some time.  Labs obtained last visit.  Question of IBS or stress component as symptoms notable at work.  She was referred to gastroenterology.  Zofran if needed for nausea.  Lipase borderline elevated but lower than previous readings.  WBC was normal hemoglobin 15.4.  Normal LFTs, had been elevated last year.  Few leukocytes in urine.  Negative nitrite. Constipation back and forth with diarrhea at times.  No blood in stool. Stopped miralax. No current fiber supplement.  Taking zofran daily. Vomiting last week - not past few days.  No dysuria/frequency/hematuria.  Occasional abd soreness - less than last visit.  No  fever.  Did not start omeprazole - not able to fill Rx - not covered. Has not tried otc.    Flu vaccine today, defers shingrix for now.   History Patient Active Problem List   Diagnosis Date Noted   Seasonal allergies 12/09/2013   Osteopenia 12/09/2013   Past Medical History:  Diagnosis Date   Allergy    Arthritis    Osteoporosis    Past Surgical History:  Procedure Laterality Date   ABDOMINAL HYSTERECTOMY     BREAST EXCISIONAL BIOPSY Left    2008 tumor removed - benign   BREAST SURGERY     TUBAL LIGATION     tumore removed  08/11/2006   Allergies  Allergen Reactions   Erythromycin Nausea And Vomiting   Iodine Rash    Pt states this is topical betadine   Prior to Admission medications   Medication Sig Start Date End Date Taking? Authorizing Provider  amLODipine (NORVASC) 5 MG tablet TAKE 1 TABLET(5 MG) BY MOUTH DAILY 04/22/21  Yes Wendie Agreste, MD  fexofenadine (ALLEGRA) 60 MG tablet Take 60 mg by mouth 2 (two) times daily.   Yes  [provider]  FLUTICASONE FUROATE IN Inhale into the lungs.   Yes [provider]  Melatonin 1 MG CHEW Chew 1.5 % by mouth.   Yes [provider]  ondansetron (ZOFRAN) 4 MG tablet Take 1 tablet (4 mg total) by mouth every 8 (eight) hours as needed for nausea or vomiting. 04/21/22  Yes Wendie Agreste, MD  sertraline (ZOLOFT) 50 MG tablet Take 1 tablet (50 mg total) by mouth daily. 04/21/22  Yes Wendie Agreste, MD  benzonatate (TESSALON) 200 MG capsule Take 1 capsule (200 mg total) by mouth 3 (three) times daily as needed for cough. Patient not taking: Reported on 04/21/2022 07/24/21   Wendie Agreste, MD  hydrOXYzine (ATARAX/VISTARIL) 25 MG tablet Take 0.5-1 tablets (12.5-25 mg total) by mouth at bedtime as needed. Patient not taking: Reported on 05/08/2022 12/19/20   Wendie Agreste, MD  omeprazole (PRILOSEC) 20 MG capsule Take 1 capsule (20 mg total) by mouth daily. Patient not taking: Reported on 05/08/2022 04/21/22   Wendie Agreste, MD  sulfamethoxazole-trimethoprim (BACTRIM DS) 800-160 MG tablet Take 1 tablet by mouth 2 (two) times daily. Patient not taking: Reported on 06/21/2021 06/17/21   Wendie Agreste, MD   Social History   Socioeconomic History   Marital status: Married    Spouse name: Not on file   Number of children: Not on file   Years of education: Not on file   Highest education level: Not on file  Occupational History   Not on file  Tobacco Use   Smoking status: Former   Smokeless tobacco: Never  Substance and Sexual Activity   Alcohol use: Yes   Drug use: Not on file   Sexual activity: Not on file  Other Topics Concern   Not on file  Social History Narrative   Not on file   Social Determinants of Health   Financial Resource Strain: Not on file  Food Insecurity: Not on file  Transportation Needs: Not on file  Physical Activity: Not on file  Stress: Not on file  Social Connections: Not on file  Intimate Partner Violence:  Not on file    Review of Systems Per HPI.   Objective:   Vitals:   05/08/22 0918  BP: 124/70  Pulse: 72  Temp: 98.1 F (36.7 C)  SpO2: 99%  Weight: 129 lb (58.5 kg)  Height: 5\' 3"  (1.6 m)     Physical Exam Vitals reviewed.  Constitutional:      Appearance: Normal appearance. She is well-developed.  HENT:     Head: Normocephalic and atraumatic.  Eyes:     Conjunctiva/sclera: Conjunctivae normal.     Pupils: Pupils are equal, round, and reactive to light.  Neck:     Vascular: No carotid bruit.  Cardiovascular:     Rate and Rhythm: Normal rate and regular rhythm.     Heart sounds: Normal heart sounds.  Pulmonary:     Effort: Pulmonary effort is normal.     Breath sounds: Normal breath sounds.  Abdominal:     General: There is no distension.     Palpations: Abdomen is soft. There is no pulsatile mass.     Tenderness: There is abdominal tenderness in the epigastric area. There is no guarding or rebound.  Musculoskeletal:     Right lower leg: No edema.     Left lower leg: No edema.  Skin:    General: Skin is warm and dry.  Neurological:     Mental Status: She is alert and oriented to person, place, and time.  Psychiatric:        Mood and Affect: Mood normal.        Behavior: Behavior normal.     Comments: Flat affect, appropriate responses, does not appear to be responding to internal stimuli.  Denies suicidal ideation.        Assessment & Plan:  ADREA SHUCK is a 61 y.o. female . Epigastric pain - Plan: Lipase, CBC Elevated lipase - Plan: Lipase  -Persistent discomfort without new symptoms.  Borderline lipase previously, emesis last week, not currently.  ER precautions if emesis recurs or acute worsening..  Repeat lipase, CBC, start omeprazole daily for possible gastritis and GI follow-up planned.  Phone number provided to schedule appointment.  ER precautions.  -Diarrhea/constipation as above.  Start fiber for improved regularity, with MiraLAX if needed.   Discussed Zofran because of constipation.  Use as needed for nausea, intermittent dosing as option.  Essential hypertension - Plan: amLODipine (NORVASC) 5 MG tablet  -Stable, meds refilled.  Psychophysiological insomnia - Plan: hydrOXYzine (ATARAX) 25 MG tablet Situational stress Depression with anxiety  -Some improvement in mood symptoms with sertraline, continue same dose for now with option of higher dosing at follow-up.  Difficulty with sleep as above, repeat trial of hydroxyzine.  Continue follow-up with psychology/counseling as planned.  Recheck 1 month.  Needs flu shot - Plan: Flu Vaccine QUAD 21mo+IM (Fluarix, Fluzone & Alfiuria Quad PF)   Meds ordered this encounter  Medications   amLODipine (NORVASC) 5 MG tablet    Sig: TAKE 1 TABLET(5 MG) BY MOUTH DAILY    Dispense:  90 tablet    Refill:  1   hydrOXYzine (ATARAX) 25 MG tablet    Sig: Take 0.5-1 tablets (12.5-25 mg total) by mouth at bedtime as needed.    Dispense:  30 tablet    Refill:  1   Patient Instructions  Try adding citrucel or metamucil daily to see if adding fiber will help regulate bowels.  Miralax only if needed for constipation.  Start prilosec over the counter once per day - call gastroenterology for appointment:  [phone] If any lab concerns I will let you know.  If vomiting returns, be seen at Urgent care or ER if needed.   Same dose of zoloft for now. Hydroxyzine  at bedtime.  Recheck 1 month.   Return to the clinic or go to the nearest emergency room if any of your symptoms worsen or new symptoms occur.        Signed,   Merri Ray, MD [place] 05/08/22 9:29 AM

## 2022-05-08 NOTE — Patient Instructions (Addendum)
Try adding citrucel or metamucil daily to see if adding fiber will help regulate bowels.  Miralax only if needed for constipation.  Start prilosec over the counter once per day - call gastroenterology for appointment:  [phone] If any lab concerns I will let you know.  If vomiting returns, be seen at Urgent care or ER if needed.   Same dose of zoloft for now. Hydroxyzine at bedtime.  Recheck 1 month.   Return to the clinic or go to the nearest emergency room if any of your symptoms worsen or new symptoms occur.

## 2022-05-20 ENCOUNTER — Other Ambulatory Visit: Payer: Self-pay | Admitting: Family Medicine

## 2022-05-20 DIAGNOSIS — R1013 Epigastric pain: Secondary | ICD-10-CM

## 2022-06-09 ENCOUNTER — Encounter: Payer: Self-pay | Admitting: Family Medicine

## 2022-06-09 ENCOUNTER — Ambulatory Visit (INDEPENDENT_AMBULATORY_CARE_PROVIDER_SITE_OTHER): Payer: 59 | Admitting: Family Medicine

## 2022-06-09 VITALS — BP 122/70 | HR 82 | Temp 98.2°F | Ht 63.0 in | Wt 126.8 lb

## 2022-06-09 DIAGNOSIS — R748 Abnormal levels of other serum enzymes: Secondary | ICD-10-CM

## 2022-06-09 DIAGNOSIS — R1013 Epigastric pain: Secondary | ICD-10-CM | POA: Diagnosis not present

## 2022-06-09 DIAGNOSIS — F5104 Psychophysiologic insomnia: Secondary | ICD-10-CM | POA: Diagnosis not present

## 2022-06-09 DIAGNOSIS — R1012 Left upper quadrant pain: Secondary | ICD-10-CM

## 2022-06-09 DIAGNOSIS — F418 Other specified anxiety disorders: Secondary | ICD-10-CM | POA: Diagnosis not present

## 2022-06-09 DIAGNOSIS — F439 Reaction to severe stress, unspecified: Secondary | ICD-10-CM

## 2022-06-09 LAB — LIPASE: Lipase: 50 U/L (ref 11.0–59.0)

## 2022-06-09 NOTE — Patient Instructions (Addendum)
For the elevated lipase and abdominal pain I would like to repeat the CT scan of the abdomen.  There was a small cyst in the right side last year that ultrasound was recommended but we can start with a CT scan and if still present can check ultrasound as well. Continue omeprazole for now.  I will repeat the pancreas test today. Depending on CT scan results, may need to have gastroenterology see and consider other testing.  If any acute worsening of symptoms or new symptoms, be seen right away.  Continue sertraline, glad to hear things are improving.  Continue hydroxyzine if needed for sleep.  Recheck in 6 weeks, sooner if needed.  Hang in there.

## 2022-06-09 NOTE — Progress Notes (Signed)
Subjective:  Patient ID: Jenny Davidson, female    DOB: 07-Aug-1961  Age: 61 y.o. MRN: [id number]  CC:  Chief Complaint  Patient presents with   Depression    Pt states all is well PHQ9 - 10   Anxiety    GAD - 9   Insomnia    Pt stil has to take meds to sleep    HPI Jenny Davidson presents for   Depression with anxiety: With insomnia.  See previous visits.  Situational stressors with work, has been started on Zoloft 50 mg daily.  She was working on some leads for new jobs but none yet at her September 28 visit.  Was also plan on following up with counselor.  Recommended hydroxyzine for sleep, melatonin ineffective.  Working on possible lead at [employer]. Still at same job. Feeling better. Job is main stressor. Usually happy outside of work. Feels good on weekends.  No new side effects with zoloft.  Hydroxyzine is helping with sleep along with melatonin.  Denies true suicidal ideation since last visit - fleeting thought of what if not here prior - no intent/plan.      06/09/2022   10:16 AM 05/08/2022    9:17 AM 04/21/2022   10:58 AM 11/17/2019    4:50 PM  GAD 7 : Generalized Anxiety Score  Nervous, Anxious, on Edge 1 3 2 3   Control/stop worrying 1 3 3 3   Worry too much - different things 2 3 3 3   Trouble relaxing 2 2 2 2   Restless 1 1 1 1   Easily annoyed or irritable 1 3 0 0  Afraid - awful might happen 1 3 3 1   Total GAD 7 Score 9 18 14 13   Anxiety Difficulty   Extremely difficult         06/09/2022   10:15 AM 05/08/2022    9:15 AM 04/21/2022   10:48 AM 06/24/2021    1:25 PM 06/08/2020    8:07 AM  Depression screen PHQ 2/9  Decreased Interest 1 3 3 3  0  Down, Depressed, Hopeless 1 3 3 3  0  PHQ - 2 Score 2 6 6 6  0  Altered sleeping 1 3 3 3    Tired, decreased energy 2 3 3 3    Change in appetite 2 3 3 3    Feeling bad or failure about yourself  1 2 2 2    Trouble concentrating 1 0 0 2   Moving slowly or fidgety/restless 0 0 0 0   Suicidal thoughts 1 1 2  0   PHQ-9  Score 10 18 19 19    Difficult doing work/chores   Extremely dIfficult      Recurrent abdominal pain Persistent discomfort discussed at her last visit in September.  No new symptoms at that time.  Borderline lipase previously with emesis week prior but not at time of visit.  Repeat lipase, CBC obtained and restarted omeprazole daily for possible gastritis component.  GI follow-up was planned.  Also discussed fiber for improved regularity with MiraLAX if needed for constipation.  Had used Zofran for nausea which likely was contributing to constipation.  Intermittent dosing recommended and only if needed.  Lipase persistently elevated including at last visit.  Previous imaging 06/2021 -  5 cm right adnexal simple appearing cyst, recommended follow-up pelvic ultrasound in 6 to 12 months.  No other acute process in abdomen or pelvis.Pancreas: Unremarkable. No pancreatic ductal dilatation or surrounding inflammatory changes.  Still some upper abdominal pain, no recent vomiting.  Still nausea at times - few times per week. Prilosec daily. No melena/hematochezia. No prior coffee ground emesis or hemoptysis.  No fever, no night sweats. Weight has decreased some but still some decreased appetite.  Bowels moving better - more fiber. No diarrhea, constipation is less. Softer stools - every few days. No blood in stool.  Wt Readings from Last 3 Encounters:  06/09/22 126 lb 12.8 oz (57.5 kg)  05/08/22 129 lb (58.5 kg)  04/21/22 130 lb 9.6 oz (59.2 kg)     Results for orders placed or performed in visit on 05/08/22  Lipase  Result Value Ref Range   Lipase 72.0 (H) 11.0 - 59.0 U/L  CBC  Result Value Ref Range   WBC 7.8 4.0 - 10.5 K/uL   RBC 5.27 (H) 3.87 - 5.11 Mil/uL   Platelets 277.0 150.0 - 400.0 K/uL   Hemoglobin 14.7 12.0 - 15.0 g/dL   HCT 44.3 15.4 - 00.8 %   MCV 84.0 78.0 - 100.0 fl   MCHC 33.2 30.0 - 36.0 g/dL   RDW 67.6 19.5 - 09.3 %       History Patient Active Problem List   Diagnosis  Date Noted   Seasonal allergies 12/09/2013   Osteopenia 12/09/2013   Past Medical History:  Diagnosis Date   Allergy    Arthritis    Osteoporosis    Past Surgical History:  Procedure Laterality Date   ABDOMINAL HYSTERECTOMY     BREAST EXCISIONAL BIOPSY Left    2008 tumor removed - benign   BREAST SURGERY     TUBAL LIGATION     tumore removed  08/11/2006   Allergies  Allergen Reactions   Erythromycin Nausea And Vomiting   Iodine Rash    Pt states this is topical betadine   Prior to Admission medications   Medication Sig Start Date End Date Taking? Authorizing Provider  amLODipine (NORVASC) 5 MG tablet TAKE 1 TABLET(5 MG) BY MOUTH DAILY 05/08/22  Yes Shade Flood, MD  fexofenadine (ALLEGRA) 60 MG tablet Take 60 mg by mouth 2 (two) times daily.   Yes [provider]  FLUTICASONE FUROATE IN Inhale into the lungs.   Yes [provider]  hydrOXYzine (ATARAX) 25 MG tablet Take 0.5-1 tablets (12.5-25 mg total) by mouth at bedtime as needed. 05/08/22  Yes Shade Flood, MD  Melatonin 1 MG CHEW Chew 1.5 % by mouth.   Yes [provider]  omeprazole (PRILOSEC) 10 MG capsule Take 10 mg by mouth daily.   Yes [provider]  ondansetron (ZOFRAN) 4 MG tablet Take 1 tablet (4 mg total) by mouth every 8 (eight) hours as needed for nausea or vomiting. 04/21/22  Yes Shade Flood, MD  sertraline (ZOLOFT) 50 MG tablet TAKE 1 TABLET(50 MG) BY MOUTH DAILY 05/20/22  Yes Shade Flood, MD  sertraline (ZOLOFT) 50 MG tablet Take 1 tablet by mouth daily. 04/21/22  Yes [provider]  fluticasone (FLONASE) 50 MCG/ACT nasal spray Place into the nose.    [provider]   Social History   Socioeconomic History   Marital status: Married    Spouse name: Not on file   Number of children: Not on file   Years of education: Not on file   Highest education level: Not on file  Occupational History   Not on file  Tobacco Use   Smoking  status: Former   Smokeless tobacco: Never  Substance and Sexual Activity   Alcohol use: Yes  Drug use: Not on file   Sexual activity: Not on file  Other Topics Concern   Not on file  Social History Narrative   Not on file   Social Determinants of Health   Financial Resource Strain: Not on file  Food Insecurity: Not on file  Transportation Needs: Not on file  Physical Activity: Not on file  Stress: Not on file  Social Connections: Not on file  Intimate Partner Violence: Not on file    Review of Systems Per HPI.   Objective:   Vitals:   06/09/22 1018  BP: 122/70  Pulse: 82  Temp: 98.2 F (36.8 C)  SpO2: 100%  Weight: 126 lb 12.8 oz (57.5 kg)  Height: 5\' 3"  (1.6 m)     Physical Exam Vitals reviewed.  Constitutional:      Appearance: Normal appearance. She is well-developed.  HENT:     Head: Normocephalic and atraumatic.  Eyes:     Conjunctiva/sclera: Conjunctivae normal.     Pupils: Pupils are equal, round, and reactive to light.  Neck:     Vascular: No carotid bruit.  Cardiovascular:     Rate and Rhythm: Normal rate and regular rhythm.     Heart sounds: Normal heart sounds.  Pulmonary:     Effort: Pulmonary effort is normal.     Breath sounds: Normal breath sounds.  Abdominal:     General: Bowel sounds are normal. There is no distension.     Palpations: Abdomen is soft. There is no mass or pulsatile mass.     Tenderness: There is abdominal tenderness (Epigastric, left upper quadrant.). There is no right CVA tenderness, left CVA tenderness, guarding or rebound.  Musculoskeletal:     Right lower leg: No edema.     Left lower leg: No edema.  Skin:    General: Skin is warm and dry.  Neurological:     Mental Status: She is alert and oriented to person, place, and time.  Psychiatric:        Mood and Affect: Mood normal.        Behavior: Behavior normal.        Thought Content: Thought content normal.        Assessment & Plan:  KAMA CAMMARANO is a  61 y.o. female . Epigastric pain - Plan: Lipase, CT Abdomen Pelvis W Contrast Elevated lipase - Plan: Lipase, CT Abdomen Pelvis W Contrast LUQ abdominal pain - Plan: CT Abdomen Pelvis W Contrast  -Persistent epigastric abdominal pain, overall stable.  Vital signs reassuring, previous CBC reassuring.  Has had elevated lipase persistently, pancreas normal on testing last year with elevated lipase.  Some weight loss as above but attributes to decreased appetite.  -Check CT abdomen pelvis, repeat lipase, may need to schedule separate ultrasound to evaluate the 5 cm adnexal cyst seen in November 2022.  -Continue omeprazole.  ER/RTC precautions.  Depending on CT results may need GI eval plus or minus endoscopy.  Psychophysiological insomnia Depression with anxiety Situational stress  -Anxiety/depression stable, and appears to be related primarily to job.  She is working on possible new opportunity.  Continue same dose sertraline for now.  Sleep also improved with use of hydroxyzine, melatonin, continue same.  6-week follow-up  No orders of the defined types were placed in this encounter.  Patient Instructions  For the elevated lipase and abdominal pain I would like to repeat the CT scan of the abdomen.  There was a small cyst in the right side last  year that ultrasound was recommended but we can start with a CT scan and if still present can check ultrasound as well. Continue omeprazole for now.  I will repeat the pancreas test today. Depending on CT scan results, may need to have gastroenterology see and consider other testing.  If any acute worsening of symptoms or new symptoms, be seen right away.  Continue sertraline, glad to hear things are improving.  Continue hydroxyzine if needed for sleep.  Recheck in 6 weeks, sooner if needed.  Hang in there.      Signed,   Meredith Staggers, MD [facility], [facility] 06/09/22 10:59 AM

## 2022-06-16 ENCOUNTER — Ambulatory Visit
Admission: RE | Admit: 2022-06-16 | Discharge: 2022-06-16 | Disposition: A | Discharge status: Home / Self Care | Payer: 59 | Source: Ambulatory Visit | Attending: Family Medicine | Admitting: Family Medicine

## 2022-06-16 ENCOUNTER — Encounter: Payer: Self-pay | Admitting: Family Medicine

## 2022-06-16 DIAGNOSIS — R748 Abnormal levels of other serum enzymes: Secondary | ICD-10-CM

## 2022-06-16 DIAGNOSIS — R1013 Epigastric pain: Secondary | ICD-10-CM

## 2022-06-16 DIAGNOSIS — R1012 Left upper quadrant pain: Secondary | ICD-10-CM

## 2022-06-16 MED ORDER — IOPAMIDOL (ISOVUE-300) INJECTION 61%
100.0000 mL | Freq: Once | INTRAVENOUS | Status: AC | PRN
  Administered 2022-06-16: 100 mL via INTRAVENOUS

## 2022-06-28 IMAGING — CT CT ABD-PELV W/ CM
2 of 5 series · 16 of 46 positions shown, 18 images · IV contrast (APPLIED)
Comparison: None.

CLINICAL DATA: Abdominal pain, fever

EXAM:
CT ABDOMEN AND PELVIS WITH CONTRAST
TECHNIQUE: Multidetector CT imaging of the abdomen and pelvis was performed
using the standard protocol following bolus administration of
intravenous contrast.
CONTRAST:  100mL OMNIPAQUE IOHEXOL 300 MG/ML  SOLN

[Series 2: abd pel w · axial · 0.79mm/px · z∈[-484,-84]mm · 13 of 90 slices shown, 15 images]
[im 5/90  soft-tissue]
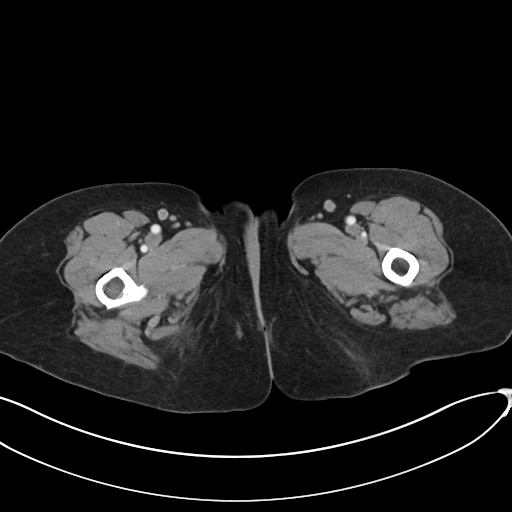
[im 5/90  bone]
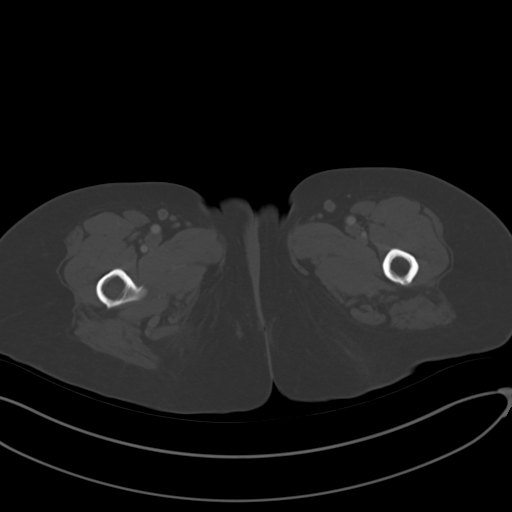
[im 10/90  soft-tissue]
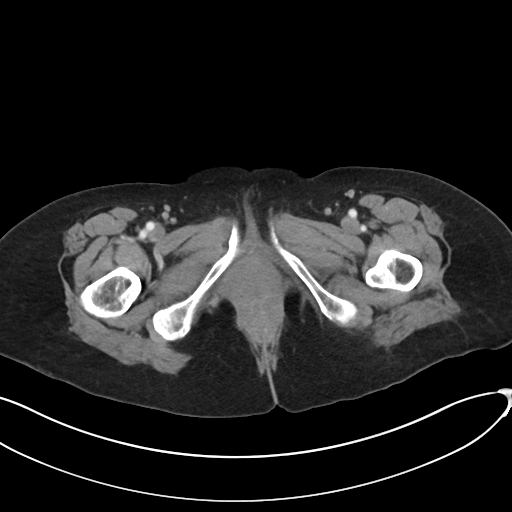
[im 20/90  soft-tissue]
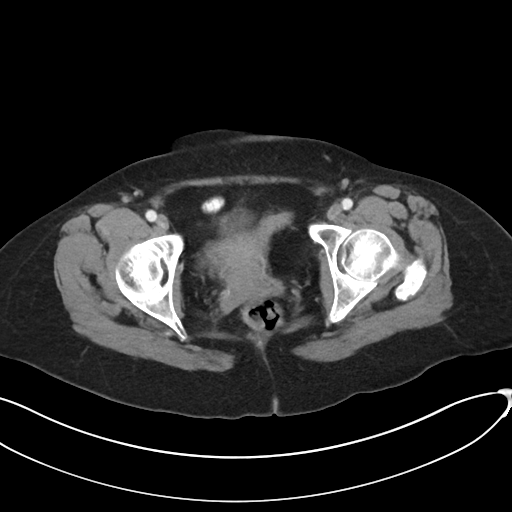
[im 25/90  soft-tissue]
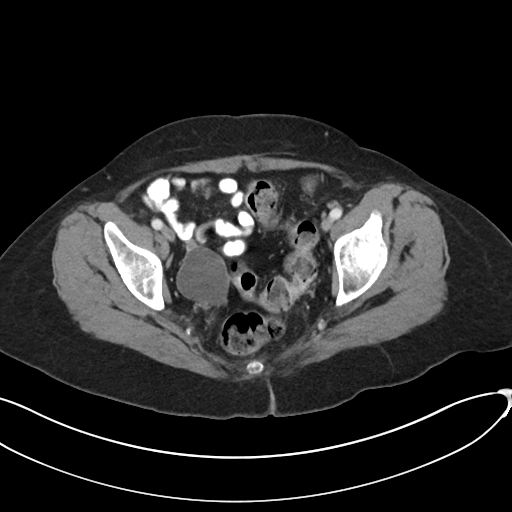
[im 30/90  soft-tissue]
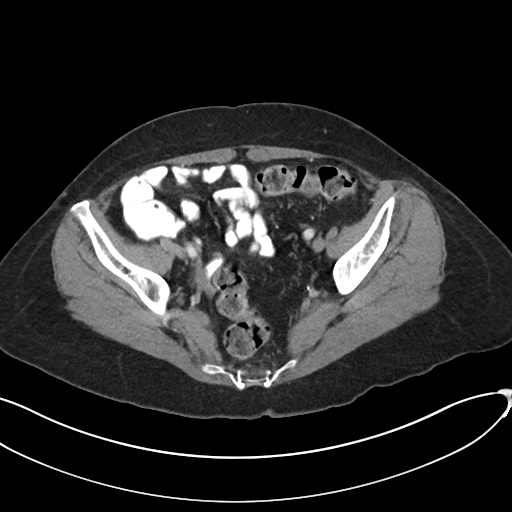
[im 40/90  soft-tissue]
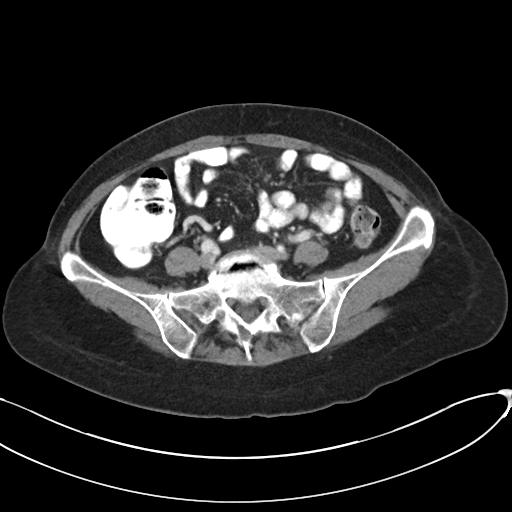
[im 45/90  soft-tissue]
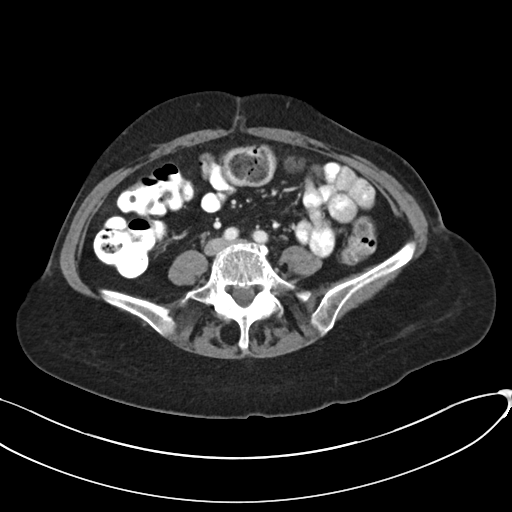
[im 50/90  soft-tissue]
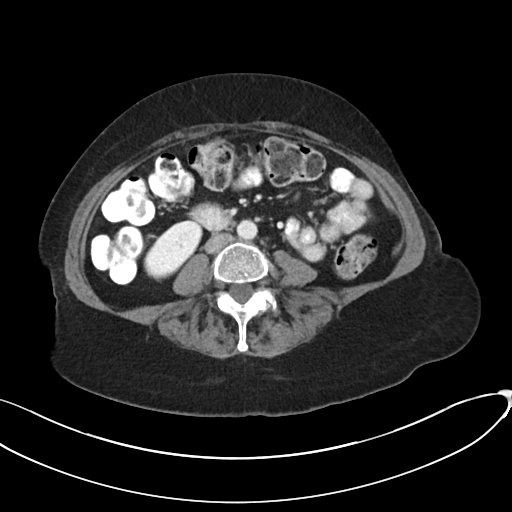
[im 60/90  soft-tissue]
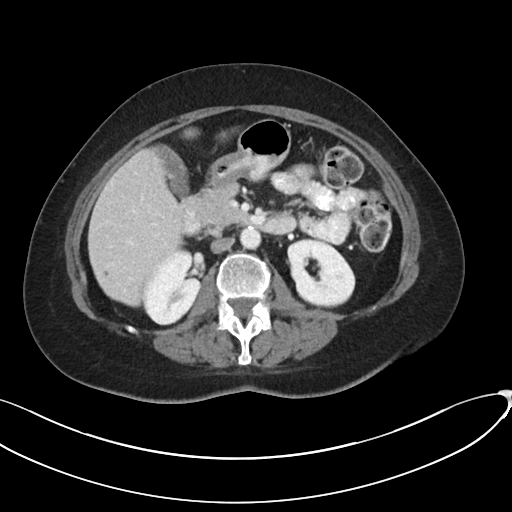
[im 60/90  bone]
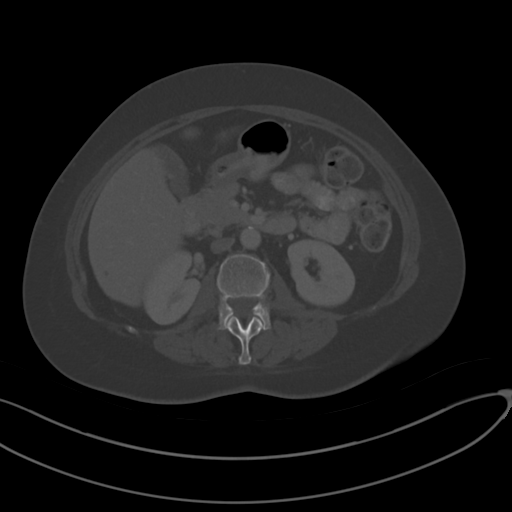
[im 65/90  soft-tissue]
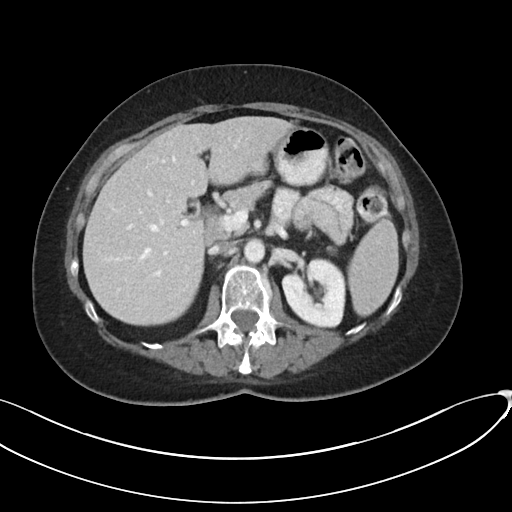
[im 70/90  soft-tissue]
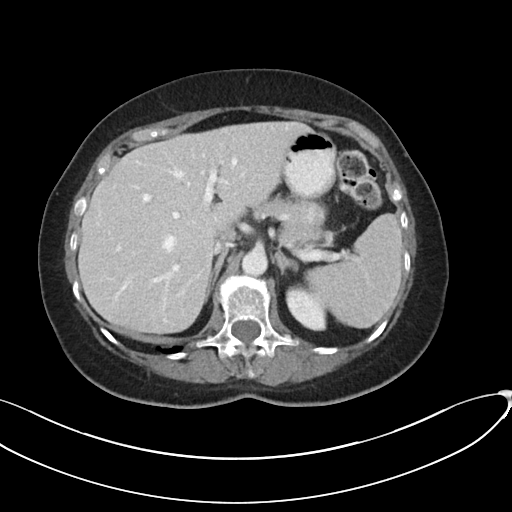
[im 80/90  soft-tissue]
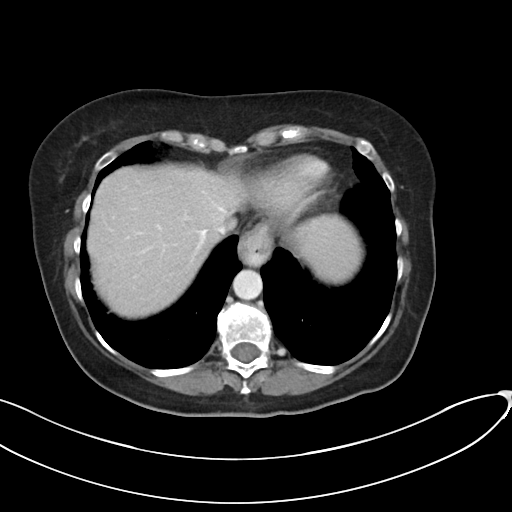
[im 85/90  soft-tissue]
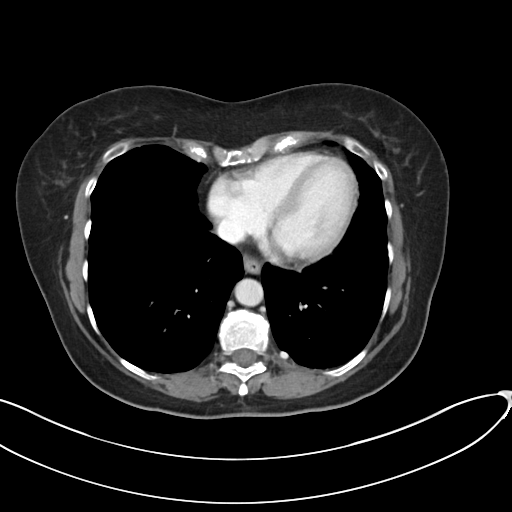

[Series 5: coronal · coronal · 0.84mm/px · 3 of 86 slices shown]
[im 29/86  soft-tissue]
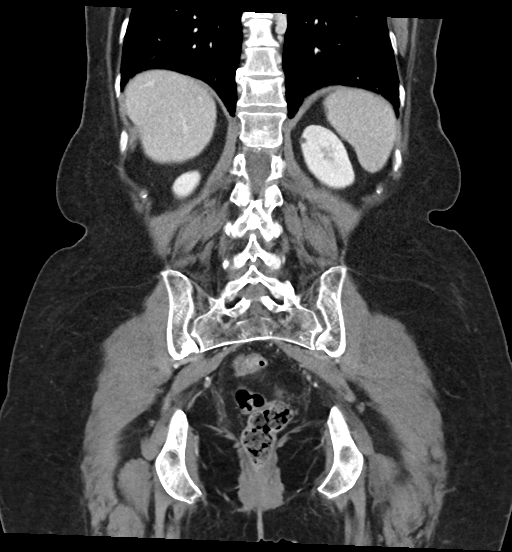
[im 38/86  soft-tissue]
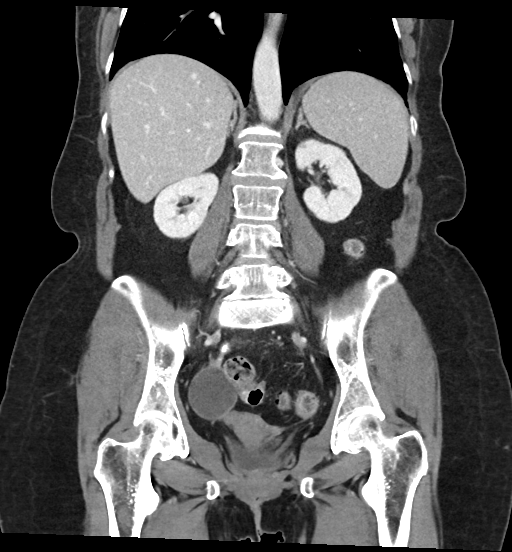
[im 48/86  soft-tissue]
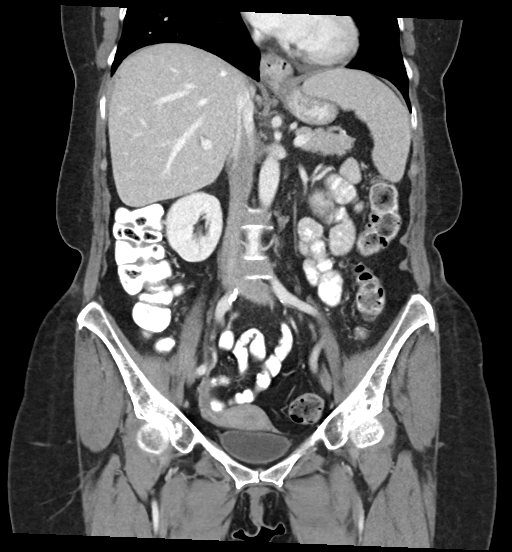

[16 of 46 positions shown; findings below may reference images not displayed]

FINDINGS: Lower chest: No acute abnormality.

Hepatobiliary: The liver is normal in contour and enhancement. Tiny
hypoattenuating lesion in the inferior right hepatic lobe, too small
to characterize. No suspicious hepatic lesions. No intra or
extrahepatic biliary ductal dilatation. The hepatic and portal veins
are patent. No gallstones, gallbladder wall thickening, or
pericholecystic fluid.

Pancreas: Unremarkable. No pancreatic ductal dilatation or
surrounding inflammatory changes.

Spleen: Normal in size without focal abnormality.

Adrenals/Urinary Tract: The adrenal glands are unremarkable. The
kidneys enhance symmetrically with no hydronephrosis or
nephrolithiasis. Tiny hypoattenuating lesions in the kidneys, too
small to characterize but most likely renal cysts. The bladder is
unremarkable for degree of distension.

Stomach/Bowel: Small hiatal hernia. The bowel is normal in caliber
with no evidence of obstruction or wall thickening. Enteric contrast
is seen to the level of the transverse colon the appendix is
unremarkable.

Vascular/Lymphatic: Aortic atherosclerosis. No enlarged abdominal or
pelvic lymph nodes.

Reproductive: The uterus is present. Right adnexal cystic lesion
measuring up to 5.0 x 4.1 x 4.6 cm (AP x TR x CC) (series 2, image
65 and series 5, image 40). The left adnexa is unremarkable.

Other: No free fluid or free air. No abdominal wall abnormality or
hernia.

Musculoskeletal: Degenerative changes in the imaged spine, with a
large Schmorl's node at the superior aspect of T12 and mild
vertebral body height loss, which appears chronic.
IMPRESSION: 1. 5 cm right adnexal simple-appearing cyst. Recommend follow-up
pelvic US in 6-12 months. (Reference: JACR [DATE]):[id number].)
Note: This recommendation does not apply to premenarchal patients
and to those with increased risk (genetic, family history, elevated
tumor markers or other high-risk factors) of ovarian cancer.
2. No other acute process in the abdomen or pelvis.
3. Small hiatal hernia.
4.  Aortic Atherosclerosis ([id number]).

## 2022-07-21 ENCOUNTER — Encounter: Payer: Self-pay | Admitting: Family Medicine

## 2022-07-21 ENCOUNTER — Ambulatory Visit (INDEPENDENT_AMBULATORY_CARE_PROVIDER_SITE_OTHER): Payer: 59 | Admitting: Family Medicine

## 2022-07-21 VITALS — BP 108/76 | HR 76 | Temp 98.6°F | Ht 63.0 in | Wt 125.4 lb

## 2022-07-21 DIAGNOSIS — Z23 Encounter for immunization: Secondary | ICD-10-CM | POA: Diagnosis not present

## 2022-07-21 DIAGNOSIS — F418 Other specified anxiety disorders: Secondary | ICD-10-CM | POA: Diagnosis not present

## 2022-07-21 DIAGNOSIS — F439 Reaction to severe stress, unspecified: Secondary | ICD-10-CM

## 2022-07-21 DIAGNOSIS — R1013 Epigastric pain: Secondary | ICD-10-CM | POA: Diagnosis not present

## 2022-07-21 DIAGNOSIS — F5104 Psychophysiologic insomnia: Secondary | ICD-10-CM | POA: Diagnosis not present

## 2022-07-21 DIAGNOSIS — Z2911 Encounter for prophylactic immunotherapy for respiratory syncytial virus (RSV): Secondary | ICD-10-CM

## 2022-07-21 MED ORDER — HYDROXYZINE HCL 25 MG PO TABS: 12.5000 mg | ORAL_TABLET | Freq: Every evening | ORAL | 1 refills | Status: DC | PRN

## 2022-07-21 MED ORDER — SERTRALINE HCL 50 MG PO TABS: ORAL_TABLET | ORAL | 2 refills | Status: DC

## 2022-07-21 NOTE — Progress Notes (Signed)
Subjective:  Patient ID: Jenny Davidson, female    DOB: 1960-12-10  Age: 61 y.o. MRN: [id number]  CC:  Chief Complaint  Patient presents with   Insomnia    Pt notes some sleep improvements most problems are work related    Anxiety    Pt notes zoloft has been okay no more issues with constipation at this time    HPI Jenny Davidson presents for   Anxiety with insomnia Situational stressors with work.  Improved with Zoloft 50 mg daily.  Working towards new job opportunities.  Melatonin was ineffective for sleep, hydroxyzine as option.  That was helping at her last visit along with melatonin. Feels like anxiety/depression managed well zoloft current dose. Still has moments, but better.  Not taking hydroxyzine every night - 1/2 the week. Melatonin most nights. Working ok.  Denies suicidal ideation.    06/09/2022   10:15 AM 05/08/2022    9:15 AM 04/21/2022   10:48 AM 06/24/2021    1:25 PM 06/08/2020    8:07 AM  Depression screen PHQ 2/9  Decreased Interest 1 3 3 3  0  Down, Depressed, Hopeless 1 3 3 3  0  PHQ - 2 Score 2 6 6 6  0  Altered sleeping 1 3 3 3    Tired, decreased energy 2 3 3 3    Change in appetite 2 3 3 3    Feeling bad or failure about yourself  1 2 2 2    Trouble concentrating 1 0 0 2   Moving slowly or fidgety/restless 0 0 0 0   Suicidal thoughts 1 1 2  0   PHQ-9 Score 10 18 19 19    Difficult doing work/chores   Extremely dIfficult       Recurrent abdominal pain Improved bowel movements as of last visit with use of more fiber in the diet.  Less constipation, no diarrhea.  No blood in the stools at that time.  Episodic nausea, upper abdominal pain with Prilosec daily.  Weight had decreased some with decreased appetite.  History of elevated lipase, normalized at her October 30 visit, and pancreas was unremarkable on CT abdomen pelvis 06/16/2022.  4 cm right ovarian or paraovarian cysts as seen on CT in 2022, likely benign or indolent process with repeat follow-up  ultrasound in 6 to 12 months recommended.  Osteopenia also noted with mild compression fracture of superior endplate of T12, chronic.  No acute osseous pathology.  bone density screening obtained earlier this year, after wrist fracture. On calcium supplements, Reclast yearly for 3 years for osteoporosis.   Still upper abdominal pain. Center stomach to upper left abdomen. Few times a week. Min nausea, no vomiting. Bowels are regular. No fever.  Appetite the same. GI referral in September. Called them and no appt.   Wt Readings from Last 3 Encounters:  07/21/22 125 lb 6.4 oz (56.9 kg)  06/09/22 126 lb 12.8 oz (57.5 kg)  05/08/22 129 lb (58.5 kg)   Requests RSV vaccine today.    History Patient Active Problem List   Diagnosis Date Noted   Seasonal allergies 12/09/2013   Osteopenia 12/09/2013   Past Medical History:  Diagnosis Date   Allergy    Arthritis    Osteoporosis    Past Surgical History:  Procedure Laterality Date   ABDOMINAL HYSTERECTOMY     BREAST EXCISIONAL BIOPSY Left    2008 tumor removed - benign   BREAST SURGERY     TUBAL LIGATION     tumore removed  08/11/2006   Allergies  Allergen Reactions   Erythromycin Nausea And Vomiting   Iodine Rash    Pt states this is topical betadine   Prior to Admission medications   Medication Sig Start Date End Date Taking? Authorizing Provider  amLODipine (NORVASC) 5 MG tablet TAKE 1 TABLET(5 MG) BY MOUTH DAILY 05/08/22  Yes Shade Flood, MD  fexofenadine (ALLEGRA) 60 MG tablet Take 60 mg by mouth 2 (two) times daily.   Yes [provider]  fluticasone (FLONASE) 50 MCG/ACT nasal spray Place into the nose.   Yes [provider]  FLUTICASONE FUROATE IN Inhale into the lungs.   Yes [provider]  hydrOXYzine (ATARAX) 25 MG tablet Take 0.5-1 tablets (12.5-25 mg total) by mouth at bedtime as needed. 05/08/22  Yes Shade Flood, MD  Melatonin 1 MG CHEW Chew 1.5 % by mouth.   Yes [provider]  omeprazole (PRILOSEC) 10 MG capsule Take 10 mg by mouth daily.   Yes [provider]  ondansetron (ZOFRAN) 4 MG tablet Take 1 tablet (4 mg total) by mouth every 8 (eight) hours as needed for nausea or vomiting. 04/21/22  Yes Shade Flood, MD  sertraline (ZOLOFT) 50 MG tablet TAKE 1 TABLET(50 MG) BY MOUTH DAILY 05/20/22  Yes Shade Flood, MD  sertraline (ZOLOFT) 50 MG tablet Take 1 tablet by mouth daily. 04/21/22  Yes [provider]   Social History   Socioeconomic History   Marital status: Married    Spouse name: Not on file   Number of children: Not on file   Years of education: Not on file   Highest education level: Not on file  Occupational History   Not on file  Tobacco Use   Smoking status: Former   Smokeless tobacco: Never  Substance and Sexual Activity   Alcohol use: Yes   Drug use: Not on file   Sexual activity: Not on file  Other Topics Concern   Not on file  Social History Narrative   Not on file   Social Determinants of Health   Financial Resource Strain: Not on file  Food Insecurity: Not on file  Transportation Needs: Not on file  Physical Activity: Not on file  Stress: Not on file  Social Connections: Not on file  Intimate Partner Violence: Not on file    Review of Systems Per HPI.   Objective:   Vitals:   07/21/22 1032  BP: 108/76  Pulse: 76  Temp: 98.6 F (37 C)  TempSrc: Oral  SpO2: 97%  Weight: 125 lb 6.4 oz (56.9 kg)  Height: 5\' 3"  (1.6 m)     Physical Exam Vitals reviewed.  Constitutional:      Appearance: Normal appearance. She is well-developed.  HENT:     Head: Normocephalic and atraumatic.  Eyes:     Conjunctiva/sclera: Conjunctivae normal.     Pupils: Pupils are equal, round, and reactive to light.  Neck:     Vascular: No carotid bruit.  Cardiovascular:     Rate and Rhythm: Normal rate and regular rhythm.     Heart sounds: Normal heart sounds.  Pulmonary:     Effort: Pulmonary  effort is normal.     Breath sounds: Normal breath sounds.  Abdominal:     Palpations: Abdomen is soft. There is no pulsatile mass.     Tenderness: There is no abdominal tenderness.  Musculoskeletal:     Right lower leg: No edema.     Left  lower leg: No edema.  Skin:    General: Skin is warm and dry.  Neurological:     Mental Status: She is alert and oriented to person, place, and time.  Psychiatric:        Mood and Affect: Mood normal.        Behavior: Behavior normal.        Assessment & Plan:  Jenny Davidson is a 61 y.o. female . Depression with anxiety Psychophysiological insomnia - Plan: hydrOXYzine (ATARAX) 25 MG tablet Situational stress  -Depression/anxiety symptoms have improved.  Continue sertraline, hydroxyzine as needed for sleep.  Recheck in 3 months.  Epigastric pain -  Persistent, intermittent episodes, reassuring CT last visit, normalization of lipase.  Previous elevation.  Referred to gastroenterology in September, but difficulty with contact.  Provided her number again to contact their office to discuss abdominal pain, RTC/ER precautions given.  New referral can be placed if needed.  Need for RSV vaccination - Plan: RSV,Recombinant PF (Arexvy) given.   Meds ordered this encounter  Medications   sertraline (ZOLOFT) 50 MG tablet    Sig: TAKE 1 TABLET(50 MG) BY MOUTH DAILY    Dispense:  90 tablet    Refill:  2   hydrOXYzine (ATARAX) 25 MG tablet    Sig: Take 0.5-1 tablets (12.5-25 mg total) by mouth at bedtime as needed.    Dispense:  30 tablet    Refill:  1   Patient Instructions  Overall labs and CT reassuring last visit. Stay on acid blocker once per day. I want you to follow up with gastroenterology for the abdominal pain. I referred you in September - should not need a new referral. Here is their number: [facility]:  [phone].  Be seen if any worsening of pain or new symptoms.   No other med changes today. Hang in there.   Return  to the clinic or go to the nearest emergency room if any of your symptoms worsen or new symptoms occur.      Signed,   Meredith Staggers, MD [facility], [facility] 07/21/22 11:01 AM

## 2022-07-21 NOTE — Patient Instructions (Addendum)
Overall labs and CT reassuring last visit. Stay on acid blocker once per day. I want you to follow up with gastroenterology for the abdominal pain. I referred you in September - should not need a new referral. Here is their number:  Gastroenterology:  [phone].  Be seen if any worsening of pain or new symptoms.   No other med changes today. Hang in there.   Return to the clinic or go to the nearest emergency room if any of your symptoms worsen or new symptoms occur.

## 2022-08-01 ENCOUNTER — Encounter: Payer: Self-pay | Admitting: Gastroenterology

## 2022-09-02 ENCOUNTER — Ambulatory Visit: Payer: 59 | Admitting: Gastroenterology

## 2022-10-07 ENCOUNTER — Encounter: Payer: Self-pay | Admitting: Gastroenterology

## 2022-10-07 ENCOUNTER — Ambulatory Visit (INDEPENDENT_AMBULATORY_CARE_PROVIDER_SITE_OTHER): Payer: 59 | Admitting: Gastroenterology

## 2022-10-07 VITALS — BP 108/78 | HR 80 | Ht 63.0 in | Wt 132.4 lb

## 2022-10-07 DIAGNOSIS — F411 Generalized anxiety disorder: Secondary | ICD-10-CM

## 2022-10-07 DIAGNOSIS — R110 Nausea: Secondary | ICD-10-CM | POA: Diagnosis not present

## 2022-10-07 DIAGNOSIS — R1013 Epigastric pain: Secondary | ICD-10-CM

## 2022-10-07 DIAGNOSIS — Z1211 Encounter for screening for malignant neoplasm of colon: Secondary | ICD-10-CM

## 2022-10-07 DIAGNOSIS — R1012 Left upper quadrant pain: Secondary | ICD-10-CM | POA: Diagnosis not present

## 2022-10-07 NOTE — Patient Instructions (Addendum)
_______________________________________________________  If your blood pressure at your visit was 140/90 or greater, please contact your primary care physician to follow up on this. _______________________________________________________ If you are age 64 or younger, your body mass index should be between 19-25. Your Body mass index is 23.45 kg/m. If this is out of the aformentioned range listed, please consider follow up with your Primary Care Provider.  __________________________________________________________  The [facility] providers would like to encourage you to use [facility] to communicate with providers for non-urgent requests or questions.  Due to long hold times on the telephone, sending your provider a message by [facility] may be a faster and more efficient way to get a response.  Please allow 48 business hours for a response.  Please remember that this is for non-urgent requests.   You have been scheduled for an endoscopy. Please follow written instructions given to you at your visit today. If you use inhalers (even only as needed), please bring them with you on the day of your procedure.  Due to recent changes in healthcare laws, you may see the results of your imaging and laboratory studies on MyChart before your provider has had a chance to review them.  We understand that in some cases there may be results that are confusing or concerning to you. Not all laboratory results come back in the same time frame and the provider may be waiting for multiple results in order to interpret others.  Please give [place] 48 hours in order for your provider to thoroughly review all the results before contacting the office for clarification of your results.   Thank you for choosing me and [facility].  Vito Cirigliano, D.O.

## 2022-10-07 NOTE — Progress Notes (Signed)
Chief Complaint: Nausea, epigastric pain, LUQ pain   Referring Provider:     Wendie Agreste, MD   HPI:     Jenny Davidson is a 62 y.o. female with a history of anxiety/depression, insomnia, osteopenia, referred to the Gastroenterology Clinic for evaluation of nausea and upper abdominal pain.  Intermittent episodes of nausea 2-3 times per week.  No emesis.  Unrelated to the abdominal pain.   Separately, with epigastric pain that can radiate to the LUQ for the last several months. Sxs can last 30 mins to several hours. Can occur 2-3 days/week. Not related to PO intake. No alleviating factors.  Likely worse with stress/anxiety.  Sxs started worsening after going back to work 03/2022 (was out of work in 11/2021 due to wrist fracture). Was dealing with anxiety and depression. 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  Started Zoloft in Sept w/ overall improvement in anxiety/stress. Started  Atarax prn qhs shortly after for insomnia as well.   Has had HB for years, but infrequent. Improves with Tums, which she takes 1-2 times/month.  No dysphagia.  She was prescribed Prilosec 10 mg in Sept with some improvement in nausea and abdominal pain (also at same time as Zoloft Rx as above).   - 01/02/2022: ALP 105, otherwise normal liver enzymes - 04/21/2022: Normal CMP, CBC, lipase (60) - 05/08/2022: Lipase 72.  Normal CBC - 06/09/2022: Lipase 50  -06/21/2021: CT A/P: Small HH, normal GI tract.  Normal liver, pancreas, spleen.  Right adnexal cyst measuring 5 x 4.1 x 4.6 cm - 06/16/2022: CT A/P: Normal GI tract.  Subcentimeter hepatic hypodense lesions too small to characterize.  Otherwise normal liver, GB, spleen.  4 cm right ovarian/paraovarian cyst.  Recommend follow-up ultrasound in 6-12 months  No prior EGD or colonoscopy.  Does have a Cologuard kit at home.  Otherwise no previous CRC screening.  No known family history of CRC, GI  malignancy, liver disease, pancreatic disease, or IBD.    Past Medical History:  Diagnosis Date   Allergy    Anxiety    Arthritis    Cataract    Osteoporosis    Peptic ulcer    Pneumonia      Past Surgical History:  Procedure Laterality Date   ADENOIDECTOMY     BREAST EXCISIONAL BIOPSY Left    2008 tumor removed - benign   CATARACT EXTRACTION, BILATERAL Bilateral 2021   MELANOMA EXCISION Right 1985   hand   PARTIAL HYSTERECTOMY  1995   with tubal ligation   TUBAL LIGATION     Family History  Problem Relation Age of Onset   Hypertension Mother    Atrial fibrillation Mother    Heart disease Father    Diabetes Father    Hypertension Father    Heart failure Father    Anxiety disorder Sister    Diabetes Paternal Grandmother    Social History   Tobacco Use   Smoking status: Former    Types: Cigarettes    Quit date: 1988    Years since quitting: 36.1   Smokeless tobacco: Never  Vaping Use   Vaping Use: Never used  Substance Use Topics   Alcohol use: Yes    Comment: occasional   Drug use: Never   Current Outpatient Medications  Medication Sig Dispense Refill   amLODipine (NORVASC) 5 MG tablet TAKE 1 TABLET(5 MG) BY MOUTH  DAILY 90 tablet 1   Calcium Carb-Cholecalciferol (CALCIUM 500 + D PO) Take 1 tablet by mouth daily.     fexofenadine (ALLEGRA) 60 MG tablet Take 60 mg by mouth 2 (two) times daily.     fluticasone (FLONASE) 50 MCG/ACT nasal spray Place into the nose.     FLUTICASONE FUROATE IN Inhale into the lungs.     hydrOXYzine (ATARAX) 25 MG tablet Take 0.5-1 tablets (12.5-25 mg total) by mouth at bedtime as needed. 30 tablet 1   Melatonin 1 MG CHEW Chew 1.5 % by mouth.     omeprazole (PRILOSEC) 10 MG capsule Take 10 mg by mouth daily.     ondansetron (ZOFRAN) 4 MG tablet Take 1 tablet (4 mg total) by mouth every 8 (eight) hours as needed for nausea or vomiting. 20 tablet 0   sertraline (ZOLOFT) 50 MG tablet TAKE 1 TABLET(50 MG) BY MOUTH DAILY 90 tablet 2    zoledronic acid (RECLAST) 5 MG/100ML SOLN injection Inject 5 mg into the vein. yearly     No current facility-administered medications for this visit.   Allergies  Allergen Reactions   Erythromycin Nausea And Vomiting   Iodine Rash    Pt states this is topical betadine     Review of Systems: All systems reviewed and negative except where noted in HPI.     Physical Exam:    Wt Readings from Last 3 Encounters:  10/07/22 132 lb 6 oz (60 kg)  07/21/22 125 lb 6.4 oz (56.9 kg)  06/09/22 126 lb 12.8 oz (57.5 kg)    BP 108/78 (BP Location: Left Arm, Patient Position: Sitting, Cuff Size: Normal)   Pulse 80   Ht '5\' 3"'$  (1.6 m)   Wt 132 lb 6 oz (60 kg)   BMI 23.45 kg/m  Constitutional:  Pleasant, in no acute distress. Psychiatric: Normal mood and affect. Behavior is normal. Cardiovascular: Normal rate, regular rhythm. No edema Pulmonary/chest: Effort normal and breath sounds normal. No wheezing, rales or rhonchi. Abdominal: TTP in MEG and LUQ w/o rebound or guarding. Soft, nondistended. Bowel sounds active throughout. There are no masses palpable. No hepatomegaly. Neurological: Alert and oriented to person place and time. Skin: Skin is warm and dry. No rashes noted.   ASSESSMENT AND PLAN;   1) Epigastric pain 2) LUQ pain 3) Nausea without emesis Symptoms seem temporally related to stress/anxiety with work, but did discuss possible overlapping mucosal/luminal pathologies. - EGD to evaluate for PUD, gastritis, H. pylori, etc. - Continue omeprazole as prescribed.  Otherwise will hold off on making additional medication changes pending endoscopic findings  4) Heartburn 5) Hiatal hernia Rare reflux symptoms that have been responsive to Tums.  Now on omeprazole not requiring any Tums the last couple of months. - Evaluate for erosive esophagitis, LES laxity, along with grade/severity of hernia that was noted on CT in 2022  6) Colon cancer screening No previous CRC screening.   Patient declines optical colonoscopy - Encouraged her to submit her Cologuard kit that she already has at the house  7) Anxiety 8) Depression - Discussed close relationship between anxiety and depression and GI symptomatology - Continue close follow-up with her PCM   The indications, risks, and benefits of EGD were explained to the patient in detail. Risks include but are not limited to bleeding, perforation, adverse reaction to medications, and cardiopulmonary compromise. Sequelae include but are not limited to the possibility of surgery, hospitalization, and mortality. The patient verbalized understanding and wished to proceed. All questions  answered, referred to scheduler. Further recommendations pending results of the exam.     Lavena Bullion, DO, FACG  10/07/2022, 3:53 PM   Jenny Raspberry Ranell Patrick, MD

## 2022-10-20 ENCOUNTER — Ambulatory Visit (INDEPENDENT_AMBULATORY_CARE_PROVIDER_SITE_OTHER): Payer: 59 | Admitting: Family Medicine

## 2022-10-20 ENCOUNTER — Encounter: Payer: Self-pay | Admitting: Family Medicine

## 2022-10-20 VITALS — BP 110/62 | HR 70 | Temp 98.0°F | Ht 63.0 in | Wt 132.4 lb

## 2022-10-20 DIAGNOSIS — F5104 Psychophysiologic insomnia: Secondary | ICD-10-CM | POA: Diagnosis not present

## 2022-10-20 DIAGNOSIS — F418 Other specified anxiety disorders: Secondary | ICD-10-CM | POA: Diagnosis not present

## 2022-10-20 DIAGNOSIS — I10 Essential (primary) hypertension: Secondary | ICD-10-CM

## 2022-10-20 DIAGNOSIS — Z23 Encounter for immunization: Secondary | ICD-10-CM

## 2022-10-20 DIAGNOSIS — M810 Age-related osteoporosis without current pathological fracture: Secondary | ICD-10-CM

## 2022-10-20 DIAGNOSIS — E785 Hyperlipidemia, unspecified: Secondary | ICD-10-CM

## 2022-10-20 LAB — COMPREHENSIVE METABOLIC PANEL
ALT: 14 U/L (ref 0–35)
AST: 22 U/L (ref 0–37)
Albumin: 4.2 g/dL (ref 3.5–5.2)
Alkaline Phosphatase: 69 U/L (ref 39–117)
BUN: 14 mg/dL (ref 6–23)
CO2: 30 mEq/L (ref 19–32)
Calcium: 9.6 mg/dL (ref 8.4–10.5)
Chloride: 104 mEq/L (ref 96–112)
Creatinine, Ser: 0.72 mg/dL (ref 0.40–1.20)
GFR: 88.67 mL/min (ref >60.00)
Glucose, Bld: 87 mg/dL (ref 70–99)
Potassium: 4.8 mEq/L (ref 3.5–5.1)
Sodium: 141 mEq/L (ref 135–145)
Total Bilirubin: 0.4 mg/dL (ref 0.2–1.2)
Total Protein: 7 g/dL (ref 6.0–8.3)

## 2022-10-20 LAB — LIPID PANEL
Cholesterol: 225 mg/dL — ABNORMAL HIGH (ref 0–200)
HDL: 67.8 mg/dL (ref >39.00)
LDL Cholesterol: 139 mg/dL — ABNORMAL HIGH (ref 0–99)
NonHDL: 157.17
Total CHOL/HDL Ratio: 3
Triglycerides: 91 mg/dL (ref 0.0–149.0)
VLDL: 18.2 mg/dL (ref 0.0–40.0)

## 2022-10-20 MED ORDER — AMLODIPINE BESYLATE 5 MG PO TABS: ORAL_TABLET | ORAL | 1 refills | Status: DC

## 2022-10-20 MED ORDER — HYDROXYZINE HCL 25 MG PO TABS: 12.5000 mg | ORAL_TABLET | Freq: Every evening | ORAL | 1 refills | Status: DC | PRN

## 2022-10-20 NOTE — Assessment & Plan Note (Signed)
No current meds, low ASCVD risk were previously.  Check updated labs, ASCVD scoring and adjustment and plan accordingly.

## 2022-10-20 NOTE — Patient Instructions (Addendum)
I would consider counseling. Online like [employer] may be an option. Let me know if there are other resources needed. No change in meds today. If any concerns on labs I will let you know.   No med changes for now.   Take care!

## 2022-10-20 NOTE — Assessment & Plan Note (Signed)
History of Colles' fracture, treated by Ortho.  Concern for work from high surfaces, ladder at work due to concern of fall.  Letter completed for work.

## 2022-10-20 NOTE — Assessment & Plan Note (Signed)
Tolerating current regimen of amlodipine, stable control, continue same.  Updated labs ordered.

## 2022-10-20 NOTE — Assessment & Plan Note (Signed)
Stable with melatonin, hydroxyzine, use lowest effective dose, continue same.

## 2022-10-20 NOTE — Assessment & Plan Note (Signed)
Still some intermittent anxiety/depression symptoms as above.  Denies suicidal ideation.  Option of higher dosing of meds discussed, prefers to remain at same dose.  Counseling recommended including online option if needed.  Deflection of stressors and coping techniques briefly discussed.  RTC precautions.  36-monthfollow-up for physical

## 2022-10-20 NOTE — Progress Notes (Unsigned)
Subjective:  Patient ID: Jenny Davidson, female    DOB: Mar 27, 1961  Age: 62 y.o. MRN: [id number]  CC:  Chief Complaint  Patient presents with   Depression    PHQ-9 =11    Hypertension   Immunizations    Pt agreeable to shingrix     HPI Jenny Davidson presents for   Depression: Last discussed in December.  Depression with anxiety, situational stressors with work.  Improving with Zoloft 50 mg daily and was working towards new job opportunities.  Hydroxyzine as needed for sleep, few days per week, melatonin other nights.  Working okay at her December visit.  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  Current dose zoloft working well - no SI/HI. Hydroxyzine few nights per week working ok.  No counseling.   Saw GI - planned endoscopy April 15th. Sometimes more sore after stressful situation.  Saw ortho for wrist issues - 1 month restrictions starting March 7th. Left distal radius fracture. Concerned about climbing up on tall ladder and hx of osteoporosis.        10/20/2022    9:22 AM 06/09/2022   10:15 AM 05/08/2022    9:15 AM 04/21/2022   10:48 AM 06/24/2021    1:25 PM  Depression screen PHQ 2/9  Decreased Interest '1 1 3 3 3  '$ Down, Depressed, Hopeless '1 1 3 3 3  '$ PHQ - 2 Score '2 2 6 6 6  '$ Altered sleeping '2 1 3 3 3  '$ Tired, decreased energy '2 2 3 3 3  '$ Change in appetite '2 2 3 3 3  '$ Feeling bad or failure about yourself  '1 1 2 2 2  '$ Trouble concentrating 1 1 0 0 2  Moving slowly or fidgety/restless 1 0 0 0 0  Suicidal thoughts 0 '1 1 2 '$ 0  PHQ-9 Score '11 10 18 19 19  '$ Difficult doing work/chores    Extremely dIfficult    Hypertension: Amlodipine 5 mg daily.no new side effects.  Home readings:none.  BP Readings from Last 3 Encounters:  10/20/22 110/62  10/07/22 108/78  07/21/22 108/76   Lab Results  Component Value Date   CREATININE 0.73 04/21/2022   Lab Results  Component Value Date   CHOL 240 (H) 06/08/2020   HDL 66  06/08/2020   LDLCALC 153 (H) 06/08/2020   TRIG 117 06/08/2020   CHOLHDL 3.6 06/08/2020   The 10-year ASCVD risk score (Arnett DK, et al., 2019) is: 4.6%   Values used to calculate the score:     Age: 60 years     Sex: Female     Is Non-Hispanic African American: No     Diabetic: No     Tobacco smoker: No     Systolic Blood Pressure: [id number] mmHg     Is BP treated: Yes     HDL Cholesterol: 66 mg/dL     Total Cholesterol: 240 mg/dL  HM: Shingles vaccine today. Possible side effects discussed.    History Patient Active Problem List   Diagnosis Date Noted   Essential hypertension 10/20/2022   Psychophysiological insomnia 10/20/2022   Hyperlipidemia 10/20/2022   Depression with anxiety 10/20/2022   Osteoporosis without current pathological fracture 10/20/2022   Seasonal allergies 12/09/2013   Osteopenia 12/09/2013    Past Medical History:  Diagnosis Date   Allergy    Anxiety    Arthritis    Cataract    Osteoporosis    Peptic ulcer  Pneumonia     {History (Optional):23778}  Review of Systems  Constitutional:  Negative for fatigue and unexpected weight change.  Respiratory:  Negative for chest tightness and shortness of breath.   Cardiovascular:  Negative for chest pain, palpitations and leg swelling.  Gastrointestinal:  Negative for abdominal pain and blood in stool.  Neurological:  Negative for dizziness, syncope, light-headedness and headaches.     Objective:   Vitals:   10/20/22 0924  BP: 110/62  Pulse: 70  Temp: 98 F (36.7 C)  TempSrc: Temporal  SpO2: 98%  Weight: 132 lb 6.4 oz (60.1 kg)  Height: '5\' 3"'$  (1.6 m)   {Vitals History (Optional):23777}  Physical Exam Vitals reviewed.  Constitutional:      Appearance: Normal appearance. She is well-developed.  HENT:     Head: Normocephalic and atraumatic.  Eyes:     Conjunctiva/sclera: Conjunctivae normal.     Pupils: Pupils are equal, round, and reactive to light.  Neck:     Vascular: No carotid  bruit.  Cardiovascular:     Rate and Rhythm: Normal rate and regular rhythm.     Heart sounds: Normal heart sounds.  Pulmonary:     Effort: Pulmonary effort is normal.     Breath sounds: Normal breath sounds.  Abdominal:     Palpations: Abdomen is soft. There is no pulsatile mass.     Tenderness: There is no abdominal tenderness.  Musculoskeletal:     Right lower leg: No edema.     Left lower leg: No edema.  Skin:    General: Skin is warm and dry.  Neurological:     Mental Status: She is alert and oriented to person, place, and time.  Psychiatric:        Mood and Affect: Mood normal.        Behavior: Behavior normal.        Thought Content: Thought content normal.        Assessment & Plan:  Jenny Davidson is a 62 y.o. female . Essential hypertension Assessment & Plan: Tolerating current regimen of amlodipine, stable control, continue same.  Updated labs ordered.  Orders: -     amLODIPine Besylate; TAKE 1 TABLET(5 MG) BY MOUTH DAILY  Dispense: 90 tablet; Refill: 1  Psychophysiological insomnia Assessment & Plan: Stable with melatonin, hydroxyzine, use lowest effective dose, continue same.  Orders: -     hydrOXYzine HCl; Take 0.5-1 tablets (12.5-25 mg total) by mouth at bedtime as needed.  Dispense: 30 tablet; Refill: 1  Need for shingles vaccine -     Varicella-zoster vaccine IM  Hyperlipidemia, unspecified hyperlipidemia type Assessment & Plan: No current meds, low ASCVD risk were previously.  Check updated labs, ASCVD scoring and adjustment and plan accordingly.  Orders: -     Comprehensive metabolic panel -     Lipid panel  Depression with anxiety Assessment & Plan: Still some intermittent anxiety/depression symptoms as above.  Denies suicidal ideation.  Option of higher dosing of meds discussed, prefers to remain at same dose.  Counseling recommended including online option if needed.  Deflection of stressors and coping techniques briefly discussed.  RTC  precautions.  25-monthfollow-up for physical   Osteoporosis without current pathological fracture, unspecified osteoporosis type Assessment & Plan: History of Colles' fracture, treated by Ortho.  Concern for work from high surfaces, ladder at work due to concern of fall.  Letter completed for work.     Patient Instructions  I would consider counseling. Online like [employer]  may be an option. Let me know if there are other resources needed. No change in meds today. If any concerns on labs I will let you know.   No med changes for now.   Take care!      Signed,   Merri Ray, MD [place] 10/20/22 10:17 AM

## 2022-11-24 ENCOUNTER — Encounter: Payer: Self-pay | Admitting: Gastroenterology

## 2022-11-24 ENCOUNTER — Ambulatory Visit (AMBULATORY_SURGERY_CENTER): Payer: 59 | Admitting: Gastroenterology

## 2022-11-24 VITALS — BP 119/71 | HR 71 | Temp 98.4°F | Resp 13 | Ht 63.0 in | Wt 132.0 lb

## 2022-11-24 DIAGNOSIS — R110 Nausea: Secondary | ICD-10-CM | POA: Diagnosis not present

## 2022-11-24 DIAGNOSIS — K2950 Unspecified chronic gastritis without bleeding: Secondary | ICD-10-CM

## 2022-11-24 DIAGNOSIS — R1012 Left upper quadrant pain: Secondary | ICD-10-CM | POA: Diagnosis present

## 2022-11-24 DIAGNOSIS — R1013 Epigastric pain: Secondary | ICD-10-CM

## 2022-11-24 DIAGNOSIS — K449 Diaphragmatic hernia without obstruction or gangrene: Secondary | ICD-10-CM

## 2022-11-24 MED ORDER — SODIUM CHLORIDE 0.9 % IV SOLN
500.0000 mL | Freq: Once | INTRAVENOUS | Status: DC
Start: 2022-11-24 — End: 2022-11-24

## 2022-11-24 NOTE — Progress Notes (Signed)
GASTROENTEROLOGY PROCEDURE H&P NOTE   Primary Care Physician: Shade Flood, MD    Reason for Procedure:   Nausea, epigastric pain, LUQ pain   Plan:    EGD  Patient is appropriate for endoscopic procedure(s) in the ambulatory ([facility]) setting.  The nature of the procedure, as well as the risks, benefits, and alternatives were carefully and thoroughly reviewed with the patient. Ample time for discussion and questions allowed. The patient understood, was satisfied, and agreed to proceed.     HPI: Jenny Davidson is a 62 y.o. female who presents for EGD for evaluation of nausea, LUQ/MEG pain, and history of GERD (heartburn) and hiatal hernia noted on CT. No previous EGD. Currently taking Prilosec 10 mg daily, and Zofran prn.   Past Medical History:  Diagnosis Date   Allergy    Anxiety    Arthritis    Cataract    Depression    GERD (gastroesophageal reflux disease)    Hypertension    Osteoporosis    Peptic ulcer    Pneumonia     Past Surgical History:  Procedure Laterality Date   ADENOIDECTOMY     BREAST EXCISIONAL BIOPSY Left    2008 tumor removed - benign   CATARACT EXTRACTION, BILATERAL Bilateral 2021   MELANOMA EXCISION Right 1985   hand   PARTIAL HYSTERECTOMY  1995   with tubal ligation   TUBAL LIGATION      Prior to Admission medications   Medication Sig Start Date End Date Taking? Authorizing Provider  amLODipine (NORVASC) 5 MG tablet TAKE 1 TABLET(5 MG) BY MOUTH DAILY 10/20/22  Yes Shade Flood, MD  Calcium Carb-Cholecalciferol (CALCIUM 500 + D PO) Take 1 tablet by mouth daily.   Yes [provider]  fexofenadine (ALLEGRA) 60 MG tablet Take 60 mg by mouth 2 (two) times daily.   Yes [provider]  fluticasone (FLONASE) 50 MCG/ACT nasal spray Place into the nose.   Yes [provider]  FLUTICASONE FUROATE IN Inhale into the lungs.   Yes [provider]  omeprazole (PRILOSEC) 10 MG capsule Take 10 mg by mouth daily.    Yes [provider]  sertraline (ZOLOFT) 50 MG tablet TAKE 1 TABLET(50 MG) BY MOUTH DAILY 07/21/22  Yes Shade Flood, MD  hydrOXYzine (ATARAX) 25 MG tablet Take 0.5-1 tablets (12.5-25 mg total) by mouth at bedtime as needed. 10/20/22   Shade Flood, MD  Melatonin 1 MG CHEW Chew 1.5 % by mouth.    [provider]  ondansetron (ZOFRAN) 4 MG tablet Take 1 tablet (4 mg total) by mouth every 8 (eight) hours as needed for nausea or vomiting. 04/21/22   Shade Flood, MD  zoledronic acid (RECLAST) 5 MG/100ML SOLN injection Inject 5 mg into the vein. yearly    [provider]    Current Outpatient Medications  Medication Sig Dispense Refill   amLODipine (NORVASC) 5 MG tablet TAKE 1 TABLET(5 MG) BY MOUTH DAILY 90 tablet 1   Calcium Carb-Cholecalciferol (CALCIUM 500 + D PO) Take 1 tablet by mouth daily.     fexofenadine (ALLEGRA) 60 MG tablet Take 60 mg by mouth 2 (two) times daily.     fluticasone (FLONASE) 50 MCG/ACT nasal spray Place into the nose.     FLUTICASONE FUROATE IN Inhale into the lungs.     omeprazole (PRILOSEC) 10 MG capsule Take 10 mg by mouth daily.     sertraline (ZOLOFT) 50 MG tablet TAKE 1 TABLET(50 MG)  BY MOUTH DAILY 90 tablet 2   hydrOXYzine (ATARAX) 25 MG tablet Take 0.5-1 tablets (12.5-25 mg total) by mouth at bedtime as needed. 30 tablet 1   Melatonin 1 MG CHEW Chew 1.5 % by mouth.     ondansetron (ZOFRAN) 4 MG tablet Take 1 tablet (4 mg total) by mouth every 8 (eight) hours as needed for nausea or vomiting. 20 tablet 0   zoledronic acid (RECLAST) 5 MG/100ML SOLN injection Inject 5 mg into the vein. yearly     Current Facility-Administered Medications  Medication Dose Route Frequency Provider Last Rate Last Admin   0.9 %  sodium chloride infusion  500 mL Intravenous Once Arshia Spellman V, DO        Allergies as of 11/24/2022 - Review Complete 11/24/2022  Allergen Reaction Noted   Erythromycin Nausea And Vomiting 10/24/2011    Iodine Rash 10/24/2011    Family History  Problem Relation Age of Onset   Hypertension Mother    Atrial fibrillation Mother    Heart disease Father    Diabetes Father    Hypertension Father    Heart failure Father    Anxiety disorder Sister    Diabetes Paternal Grandmother     Social History   Socioeconomic History   Marital status: Married    Spouse name: Not on file   Number of children: 2   Years of education: Not on file   Highest education level: Not on file  Occupational History   Occupation: Dept manager/ [employer]  Tobacco Use   Smoking status: Former    Types: Cigarettes    Quit date: 1988    Years since quitting: 36.3   Smokeless tobacco: Never  Vaping Use   Vaping Use: Never used  Substance and Sexual Activity   Alcohol use: Yes    Comment: occasional   Drug use: Never   Sexual activity: Not on file  Other Topics Concern   Not on file  Social History Narrative   Not on file   Social Determinants of Health   Financial Resource Strain: Not on file  Food Insecurity: Not on file  Transportation Needs: Not on file  Physical Activity: Not on file  Stress: Not on file  Social Connections: Not on file  Intimate Partner Violence: Not on file    Physical Exam: Vital signs in last 24 hours: @BP  132/74 (BP Location: Left Arm, Patient Position: Sitting, Cuff Size: Normal)   Pulse 74   Temp 98.4 F (36.9 C) (Temporal)   Ht 5\' 3"  (1.6 m)   Wt 132 lb (59.9 kg)   SpO2 100%   BMI 23.38 kg/m  GEN: NAD EYE: Sclerae anicteric ENT: MMM CV: Non-tachycardic Pulm: CTA b/l GI: Soft, NT/ND NEURO:  Alert & Oriented x 3   Doristine Locks, DO [facility]   11/24/2022 9:55 AM

## 2022-11-24 NOTE — Progress Notes (Signed)
Called to room to assist during endoscopic procedure.  Patient ID and intended procedure confirmed with present staff. Received instructions for my participation in the procedure from the performing physician.  

## 2022-11-24 NOTE — Progress Notes (Signed)
Sedate, gd SR's, VSS, report to RN 

## 2022-11-24 NOTE — Patient Instructions (Signed)
Trial course of OTC FDGuard Return to GI clinic PRN  YOU HAD AN ENDOSCOPIC PROCEDURE TODAY: Refer to the procedure report and other information in the discharge instructions given to you for any specific questions about what was found during the examination. If this information does not answer your questions, please call [place] office at [phone] to clarify.   YOU SHOULD EXPECT: Some feelings of bloating in the abdomen. Passage of more gas than usual. Walking can help get rid of the air that was put into your GI tract during the procedure and reduce the bloating. If you had a lower endoscopy (such as a colonoscopy or flexible sigmoidoscopy) you may notice spotting of blood in your stool or on the toilet paper. Some abdominal soreness may be present for a day or two, also.  DIET: Your first meal following the procedure should be a light meal and then it is ok to progress to your normal diet. A half-sandwich or bowl of soup is an example of a good first meal. Heavy or fried foods are harder to digest and may make you feel nauseous or bloated. Drink plenty of fluids but you should avoid alcoholic beverages for 24 hours. If you had a esophageal dilation, please see attached instructions for diet.    ACTIVITY: Your care partner should take you home directly after the procedure. You should plan to take it easy, moving slowly for the rest of the day. You can resume normal activity the day after the procedure however YOU SHOULD NOT DRIVE, use power tools, machinery or perform tasks that involve climbing or major physical exertion for 24 hours (because of the sedation medicines used during the test).   SYMPTOMS TO REPORT IMMEDIATELY: A gastroenterologist can be reached at any hour. Please call [phone]  for any of the following symptoms:  Following upper endoscopy (EGD, EUS, ERCP, esophageal dilation) Vomiting of blood or coffee ground material  New, significant abdominal pain  New, significant chest  pain or pain under the shoulder blades  Painful or persistently difficult swallowing  New shortness of breath  Black, tarry-looking or red, bloody stools  FOLLOW UP:  If any biopsies were taken you will be contacted by phone or by letter within the next 1-3 weeks. Call [phone]  if you have not heard about the biopsies in 3 weeks.  Please also call with any specific questions about appointments or follow up tests.

## 2022-11-24 NOTE — Progress Notes (Signed)
Pt's states no medical or surgical changes since previsit or office visit. 

## 2022-11-24 NOTE — Op Note (Signed)
[facility] Patient Name: Jenny Davidson Procedure Date: 11/24/2022 9:58 AM MRN: [id number] Endoscopist: Doristine Locks , MD, [id number] Age: 62 Referring MD:  Date of Birth: 1960/12/16 Gender: Female Account #: [id number] Procedure:                Upper GI endoscopy Indications:              Epigastric abdominal pain, Abdominal pain in the                            left upper quadrant, Heartburn, Nausea Medicines:                Monitored Anesthesia Care Procedure:                Pre-Anesthesia Assessment:                           - Prior to the procedure, a History and Physical                            was performed, and patient medications and                            allergies were reviewed. The patient's tolerance of                            previous anesthesia was also reviewed. The risks                            and benefits of the procedure and the sedation                            options and risks were discussed with the patient.                            All questions were answered, and informed consent                            was obtained. Prior Anticoagulants: The patient has                            taken no anticoagulant or antiplatelet agents. ASA                            Grade Assessment: II - A patient with mild systemic                            disease. After reviewing the risks and benefits,                            the patient was deemed in satisfactory condition to                            undergo the procedure.  After obtaining informed consent, the endoscope was                            passed under direct vision. Throughout the                            procedure, the patient's blood pressure, pulse, and                            oxygen saturations were monitored continuously. The                            Olympus Scope [id number] was introduced through the                            mouth, and  advanced to the second part of duodenum.                            The upper GI endoscopy was accomplished without                            difficulty. The patient tolerated the procedure                            well. Scope In: Scope Out: Findings:                 The examined esophagus was normal.                           A 2 cm hiatal hernia was present.                           The gastroesophageal flap valve was visualized                            endoscopically and classified as Hill Grade III                            (minimal fold, loose to endoscope, hiatal hernia                            likely).                           The entire examined stomach was normal. Biopsies                            were taken with a cold forceps for Helicobacter                            pylori testing. Estimated blood loss was minimal.                           The examined duodenum was normal. Complications:  No immediate complications. Estimated Blood Loss:     Estimated blood loss was minimal. Impression:               - Normal esophagus.                           - 2 cm hiatal hernia.                           - Gastroesophageal flap valve classified as Hill                            Grade III (minimal fold, loose to endoscope, hiatal                            hernia likely).                           - Normal stomach. Biopsied.                           - Normal examined duodenum. Recommendation:           - Patient has a contact number available for                            emergencies. The signs and symptoms of potential                            delayed complications were discussed with the                            patient. Return to normal activities tomorrow.                            Written discharge instructions were provided to the                            patient.                           - Resume previous diet.                           -  Continue present medications.                           - Await pathology results.                           - Trial course of OTC FDGuard.                           - Return to GI clinic PRN. Doristine Locks, MD 11/24/2022 10:14:25 AM

## 2022-11-25 ENCOUNTER — Telehealth: Payer: Self-pay

## 2022-11-25 NOTE — Telephone Encounter (Signed)
  Follow up Call-     11/24/2022    9:32 AM 11/24/2022    9:28 AM  Call back number  Post procedure Call Back phone  # [phone]   Permission to leave phone message  Yes     Patient questions:  Do you have a fever, pain , or abdominal swelling? No. Pain Score  0 *  Have you tolerated food without any problems? Yes.    Have you been able to return to your normal activities? Yes.    Do you have any questions about your discharge instructions: Diet   No. Medications  No. Follow up visit  No.  Do you have questions or concerns about your Care? No.  Actions: * If pain score is 4 or above: No action needed, pain <4.

## 2022-12-01 ENCOUNTER — Encounter: Payer: Self-pay | Admitting: Gastroenterology

## 2023-02-26 ENCOUNTER — Other Ambulatory Visit: Payer: Self-pay

## 2023-02-26 ENCOUNTER — Emergency Department (HOSPITAL_COMMUNITY)
Admission: EM | Admit: 2023-02-26 | Discharge: 2023-02-26 | Disposition: A | Discharge status: Home / Self Care | Payer: Worker's Compensation | Attending: Emergency Medicine | Admitting: Emergency Medicine

## 2023-02-26 ENCOUNTER — Emergency Department (HOSPITAL_COMMUNITY): Payer: Worker's Compensation

## 2023-02-26 ENCOUNTER — Encounter (HOSPITAL_COMMUNITY): Payer: Self-pay | Admitting: Emergency Medicine

## 2023-02-26 DIAGNOSIS — Z87891 Personal history of nicotine dependence: Secondary | ICD-10-CM | POA: Insufficient documentation

## 2023-02-26 DIAGNOSIS — S52501A Unspecified fracture of the lower end of right radius, initial encounter for closed fracture: Secondary | ICD-10-CM

## 2023-02-26 DIAGNOSIS — Y9389 Activity, other specified: Secondary | ICD-10-CM | POA: Diagnosis not present

## 2023-02-26 DIAGNOSIS — I10 Essential (primary) hypertension: Secondary | ICD-10-CM | POA: Diagnosis not present

## 2023-02-26 DIAGNOSIS — Z79899 Other long term (current) drug therapy: Secondary | ICD-10-CM | POA: Diagnosis not present

## 2023-02-26 DIAGNOSIS — W11XXXA Fall on and from ladder, initial encounter: Secondary | ICD-10-CM | POA: Insufficient documentation

## 2023-02-26 DIAGNOSIS — S52611A Displaced fracture of right ulna styloid process, initial encounter for closed fracture: Secondary | ICD-10-CM | POA: Insufficient documentation

## 2023-02-26 DIAGNOSIS — S52351A Displaced comminuted fracture of shaft of radius, right arm, initial encounter for closed fracture: Secondary | ICD-10-CM | POA: Diagnosis not present

## 2023-02-26 DIAGNOSIS — S6991XA Unspecified injury of right wrist, hand and finger(s), initial encounter: Secondary | ICD-10-CM | POA: Diagnosis present

## 2023-02-26 MED ORDER — IBUPROFEN 200 MG PO TABS
600.0000 mg | ORAL_TABLET | Freq: Once | ORAL | Status: AC
  Administered 2023-02-26: 600 mg via ORAL
  Filled 2023-02-26: qty 3

## 2023-02-26 NOTE — ED Provider Notes (Signed)
[place] EMERGENCY DEPARTMENT AT [facility] Provider Note  CSN: [id number] Arrival date & time: 02/26/23 1046  Chief Complaint(s) Wrist Pain  HPI Jenny Davidson is a 62 y.o. female history of hypertension, osteoporosis presenting to the emergency department with right wrist pain.  Patient reports that she fell off the second step of a ladder and landed on her right wrist.  She reports primarily only wrist pain and denies pain elsewhere.  No head injury, neck or back pain.  No pain to her other extremities, chest or abdomen.  No numbness or tingling.  No wounds.   Past Medical History Past Medical History:  Diagnosis Date   Allergy    Anxiety    Arthritis    Cataract    Depression    GERD (gastroesophageal reflux disease)    Hypertension    Osteoporosis    Peptic ulcer    Pneumonia    Patient Active Problem List   Diagnosis Date Noted   Essential hypertension 10/20/2022   Psychophysiological insomnia 10/20/2022   Hyperlipidemia 10/20/2022   Depression with anxiety 10/20/2022   Osteoporosis without current pathological fracture 10/20/2022   Seasonal allergies 12/09/2013   Osteopenia 12/09/2013   Home Medication(s) Prior to Admission medications   Medication Sig Start Date End Date Taking? Authorizing Provider  amLODipine (NORVASC) 5 MG tablet TAKE 1 TABLET(5 MG) BY MOUTH DAILY 10/20/22   Shade Flood, MD  Calcium Carb-Cholecalciferol (CALCIUM 500 + D PO) Take 1 tablet by mouth daily.    [provider]  fexofenadine (ALLEGRA) 60 MG tablet Take 60 mg by mouth 2 (two) times daily.    [provider]  fluticasone (FLONASE) 50 MCG/ACT nasal spray Place into the nose.    [provider]  FLUTICASONE FUROATE IN Inhale into the lungs.    [provider]  hydrOXYzine (ATARAX) 25 MG tablet Take 0.5-1 tablets (12.5-25 mg total) by mouth at bedtime as needed. 10/20/22   Shade Flood, MD  Melatonin 1 MG CHEW Chew 1.5 % by mouth.     [provider]  omeprazole (PRILOSEC) 10 MG capsule Take 10 mg by mouth daily.    [provider]  ondansetron (ZOFRAN) 4 MG tablet Take 1 tablet (4 mg total) by mouth every 8 (eight) hours as needed for nausea or vomiting. 04/21/22   Shade Flood, MD  sertraline (ZOLOFT) 50 MG tablet TAKE 1 TABLET(50 MG) BY MOUTH DAILY 07/21/22   Shade Flood, MD  zoledronic acid (RECLAST) 5 MG/100ML SOLN injection Inject 5 mg into the vein. yearly    [provider]                                                                                                                                    Past Surgical History Past Surgical History:  Procedure Laterality Date   ADENOIDECTOMY     BREAST EXCISIONAL BIOPSY  Left    2008 tumor removed - benign   CATARACT EXTRACTION, BILATERAL Bilateral 2021   MELANOMA EXCISION Right 1985   hand   PARTIAL HYSTERECTOMY  1995   with tubal ligation   TUBAL LIGATION     Family History Family History  Problem Relation Age of Onset   Hypertension Mother    Atrial fibrillation Mother    Heart disease Father    Diabetes Father    Hypertension Father    Heart failure Father    Anxiety disorder Sister    Diabetes Paternal Grandmother     Social History Social History   Tobacco Use   Smoking status: Former    Current packs/day: 0.00    Types: Cigarettes    Quit date: 1988    Years since quitting: 36.5   Smokeless tobacco: Never  Vaping Use   Vaping status: Never Used  Substance Use Topics   Alcohol use: Yes    Comment: occasional   Drug use: Never   Allergies Erythromycin and Iodine  Review of Systems Review of Systems  All other systems reviewed and are negative.   Physical Exam Vital Signs  I have reviewed the triage vital signs BP (!) 140/84   Pulse 81   Temp 98.5 F (36.9 C) (Oral)   Resp 16   Ht 5\' 3"  (1.6 m)   Wt 59 kg   SpO2 100%   BMI 23.03 kg/m  Physical Exam Vitals and nursing note  reviewed.  Constitutional:      General: She is not in acute distress.    Appearance: She is well-developed.  HENT:     Head: Normocephalic and atraumatic.     Mouth/Throat:     Mouth: Mucous membranes are moist.  Eyes:     Pupils: Pupils are equal, round, and reactive to light.  Cardiovascular:     Rate and Rhythm: Normal rate and regular rhythm.     Heart sounds: No murmur heard. Pulmonary:     Effort: Pulmonary effort is normal. No respiratory distress.     Breath sounds: Normal breath sounds.  Abdominal:     General: Abdomen is flat.     Palpations: Abdomen is soft.     Tenderness: There is no abdominal tenderness.  Musculoskeletal:     Right lower leg: No edema.     Left lower leg: No edema.     Comments: .  No midline C, T, L-spine tenderness.  Focal tenderness around the right wrist primarily on radial aspect, no snuffbox tenderness.  Range of motion of the wrist limited due to pain.  No tenderness to the hand or fingers with intact range of motion.  No tenderness to the right elbow, right shoulder, right clavicle with intact range of motion.  Full painless range of motion of the left upper extremity in the bilateral lower extremities with no focal tenderness or deformity.  Skin:    General: Skin is warm and dry.     Capillary Refill: Capillary refill takes less than 2 seconds.  Neurological:     General: No focal deficit present.     Mental Status: She is alert. Mental status is at baseline.  Psychiatric:        Mood and Affect: Mood normal.        Behavior: Behavior normal.     ED Results and Treatments Labs (all labs ordered are listed, but only abnormal results are displayed) Labs Reviewed - No data to display  Radiology DG Wrist Complete Right  Result Date: 02/26/2023 CLINICAL DATA:  Trauma, fall EXAM: RIGHT WRIST - COMPLETE 3+ VIEW COMPARISON:   None Available. FINDINGS: There is comminuted fracture in distal metaphyseal region of the radius. There is mild angulation in the dorsal cortical margin at the fracture site. There is fracture of the ulnar styloid with 2.4 mm displacement. Degenerative changes are noted with bony spurs in intercarpal and first carpometacarpal joints along the lateral aspect of the wrist. IMPRESSION: Comminuted, slightly angulated fracture is seen in the distal right radius. Displaced fracture is seen in ulnar styloid process. Degenerative changes are noted in multiple joints along the lateral aspect of the right wrist. Electronically Signed   By: Ernie Avena M.D.   On: 02/26/2023 11:08    Pertinent labs & imaging results that were available during my care of the patient were reviewed by me and considered in my medical decision making (see MDM for details).  Medications Ordered in ED Medications  ibuprofen (ADVIL) tablet 600 mg (600 mg Oral Given 02/26/23 1135)                                                                                                                                     Procedures .Ortho Injury Treatment  Date/Time: 02/26/2023 11:55 AM  Performed by: Lonell Grandchild, MD Authorized by: Lonell Grandchild, MD   Consent:    Consent obtained:  Verbal   Consent given by:  Patient   Risks discussed:  Nerve damage, restricted joint movement, vascular damage and stiffness   Alternatives discussed:  No treatmentInjury location: wrist Location details: right wrist Injury type: fracture Fracture type: distal radius and ulnar styloid Pre-procedure neurovascular assessment: neurovascularly intact Pre-procedure distal perfusion: normal Pre-procedure neurological function: normal Pre-procedure range of motion: reduced Manipulation performed: no Immobilization: splint and sling Splint type: sugar tong Splint Applied by: ED Provider and Ortho Tech Supplies used:  Ortho-Glass Post-procedure neurovascular assessment: post-procedure neurovascularly intact Post-procedure distal perfusion: normal Post-procedure neurological function: normal Post-procedure range of motion: unchanged     (including critical care time)  Medical Decision Making / ED Course   MDM:  62 year old female presenting with isolated wrist injury after fall onto her wrist.  X-ray demonstrates minimally displaced right distal radius fracture.  No sign of open fracture.  No snuffbox tenderness to suggest occult scaphoid fracture.  Also has ulnar styloid fracture.  Will place in sugar-tong splint.  Advised follow-up with hand surgery.  No other injuries evident on exam.  No signs of any neurovascular compromise.  Will discharge patient to home. All questions answered. Patient comfortable with plan of discharge. Return precautions discussed with patient and specified on the after visit summary.       Additional history obtained: -Additional history obtained from spouse   Imaging Studies ordered: I ordered imaging studies including XR wrist On my interpretation imaging demonstrates fracture I independently visualized and interpreted  imaging. I agree with the radiologist interpretation   Medicines ordered and prescription drug management: Meds ordered this encounter  Medications   ibuprofen (ADVIL) tablet 600 mg    -I have reviewed the patients home medicines and have made adjustments as needed     Reevaluation: After the interventions noted above, I reevaluated the patient and found that their symptoms have improved  Co morbidities that complicate the patient evaluation  Past Medical History:  Diagnosis Date   Allergy    Anxiety    Arthritis    Cataract    Depression    GERD (gastroesophageal reflux disease)    Hypertension    Osteoporosis    Peptic ulcer    Pneumonia       Dispostion: Disposition decision including need for hospitalization was  considered, and patient discharged from emergency department.    Final Clinical Impression(s) / ED Diagnoses Final diagnoses:  Closed fracture of distal end of right radius, unspecified fracture morphology, initial encounter     This chart was dictated using voice recognition software.  Despite best efforts to proofread,  errors can occur which can change the documentation meaning.    Lonell Grandchild, MD 02/26/23 [phone]

## 2023-02-26 NOTE — Progress Notes (Signed)
Orthopedic Tech Progress Note Patient Details:  Jenny Davidson 04/24/1961 [id number]  Ortho Devices Type of Ortho Device: Sugartong splint, Sling immobilizer Ortho Device/Splint Location: right Ortho Device/Splint Interventions: Ordered, Application, Adjustment   Post Interventions Patient Tolerated: Well Instructions Provided: Adjustment of device, Care of device  Kizzie Fantasia 02/26/2023, 11:54 AM

## 2023-02-26 NOTE — Discharge Instructions (Signed)
We evaluated you for your wrist pain.  Your x-ray shows that you have a fracture of your right radius and right ulna bones in your wrist.  We have placed you in a splint.  Hopefully you will not need hand surgery but you still need to follow-up with a hand surgeon to help determine this. Please call Dr. Kerry Fort for follow up.  Please take Tylenol and Motrin for your symptoms at home.  You can take 1000 mg of Tylenol every 6 hours and 600 mg of ibuprofen every 6 hours as needed for your symptoms.  You can take these medicines together as needed, either at the same time, or alternating every 3 hours.  Please also elevate your arm is much as possible and apply ice to help with pain and swelling.  If you have any new or worsening symptoms such as severe pain, color change in your fingers, inability to move your fingers, numbness in your hand, new pain or swelling, please return to the emergency department for a re-check.

## 2023-02-26 NOTE — ED Triage Notes (Signed)
Pt fell off ladder at work. Stepped from 3rd step to 2nd and missed and landed on floor. Landed on right side. Endorses pain to right wrist.

## 2023-03-12 DIAGNOSIS — M25531 Pain in right wrist: Secondary | ICD-10-CM | POA: Insufficient documentation

## 2023-03-16 ENCOUNTER — Encounter: Payer: Self-pay | Admitting: Family Medicine

## 2023-03-17 ENCOUNTER — Other Ambulatory Visit: Payer: Self-pay

## 2023-03-17 NOTE — Telephone Encounter (Signed)
Discussed with Jenny Davidson she is going to see if this is something we can get from physician services or if we need to try and go about this another way

## 2023-03-17 NOTE — Telephone Encounter (Signed)
I know we have the option of giving that in our office but I know there is a process on ordering it and making sure we have her most recent information from [facility].  Can we please check into what we need to order it and have it given in office? I am happy to do that if that is easier for her.

## 2023-03-26 ENCOUNTER — Encounter (INDEPENDENT_AMBULATORY_CARE_PROVIDER_SITE_OTHER): Payer: Self-pay

## 2023-04-01 DIAGNOSIS — S52501A Unspecified fracture of the lower end of right radius, initial encounter for closed fracture: Secondary | ICD-10-CM | POA: Insufficient documentation

## 2023-04-14 ENCOUNTER — Other Ambulatory Visit: Payer: Self-pay | Admitting: Family Medicine

## 2023-04-14 DIAGNOSIS — I10 Essential (primary) hypertension: Secondary | ICD-10-CM

## 2023-04-27 ENCOUNTER — Ambulatory Visit (INDEPENDENT_AMBULATORY_CARE_PROVIDER_SITE_OTHER): Payer: PRIVATE HEALTH INSURANCE | Admitting: Family Medicine

## 2023-04-27 VITALS — BP 122/74 | HR 79 | Temp 98.2°F | Ht 64.0 in | Wt 143.6 lb

## 2023-04-27 DIAGNOSIS — M810 Age-related osteoporosis without current pathological fracture: Secondary | ICD-10-CM

## 2023-04-27 DIAGNOSIS — E785 Hyperlipidemia, unspecified: Secondary | ICD-10-CM

## 2023-04-27 DIAGNOSIS — R1013 Epigastric pain: Secondary | ICD-10-CM

## 2023-04-27 DIAGNOSIS — F418 Other specified anxiety disorders: Secondary | ICD-10-CM | POA: Diagnosis not present

## 2023-04-27 DIAGNOSIS — Z0000 Encounter for general adult medical examination without abnormal findings: Secondary | ICD-10-CM | POA: Diagnosis not present

## 2023-04-27 DIAGNOSIS — I10 Essential (primary) hypertension: Secondary | ICD-10-CM | POA: Diagnosis not present

## 2023-04-27 DIAGNOSIS — Z23 Encounter for immunization: Secondary | ICD-10-CM | POA: Diagnosis not present

## 2023-04-27 LAB — COMPREHENSIVE METABOLIC PANEL
ALT: 13 U/L (ref 0–35)
AST: 20 U/L (ref 0–37)
Albumin: 4.2 g/dL (ref 3.5–5.2)
Alkaline Phosphatase: 69 U/L (ref 39–117)
BUN: 11 mg/dL (ref 6–23)
CO2: 28 meq/L (ref 19–32)
Calcium: 8.8 mg/dL (ref 8.4–10.5)
Chloride: 103 meq/L (ref 96–112)
Creatinine, Ser: 0.68 mg/dL (ref 0.40–1.20)
GFR: 92.03 mL/min (ref >60.00)
Glucose, Bld: 90 mg/dL (ref 70–99)
Potassium: 3.7 meq/L (ref 3.5–5.1)
Sodium: 140 meq/L (ref 135–145)
Total Bilirubin: 0.5 mg/dL (ref 0.2–1.2)
Total Protein: 7 g/dL (ref 6.0–8.3)

## 2023-04-27 LAB — LIPID PANEL
Cholesterol: 223 mg/dL — ABNORMAL HIGH (ref 0–200)
HDL: 55.9 mg/dL (ref >39.00)
LDL Cholesterol: 130 mg/dL — ABNORMAL HIGH (ref 0–99)
NonHDL: 167.2
Total CHOL/HDL Ratio: 4
Triglycerides: 184 mg/dL — ABNORMAL HIGH (ref 0.0–149.0)
VLDL: 36.8 mg/dL (ref 0.0–40.0)

## 2023-04-27 MED ORDER — SERTRALINE HCL 50 MG PO TABS
ORAL_TABLET | ORAL | 2 refills | Status: DC
Start: 2023-04-27 — End: 2024-01-11

## 2023-04-27 NOTE — Progress Notes (Unsigned)
Subjective:  Patient ID: Jenny Davidson, female    DOB: January 15, 1961  Age: 62 y.o. MRN: [id number]  CC:  Chief Complaint  Patient presents with   Annual Exam    Needs BMP for reclast med.     HPI [name] presents for Annual Exam Osteoporosis, osteopenia, followed by [facility], BMP requested for monitoring with use of Reclast.  Bone density on 04/09/2023 with osteopenia - improved.  Needs labs faxed to [facility], [phone] Gastroenterology, Dr. Barron Alvine, endoscopy in April.  Followed by ortho Dr. Melvyn Novas due to R radius fracture from fall in July. Wearing cast.   Hypertension: Amlodipine 5 mg daily. No new side effects.  Home readings:  120's/70's BP Readings from Last 3 Encounters:  04/27/23 122/74  02/26/23 130/80  11/24/22 119/71   Lab Results  Component Value Date   CREATININE 0.72 10/20/2022   Depression Treated with sertraline 50 mg daily.  Hydroxyzine few nights per week if needed for insomnia. Meds still working well - only intermittent need for hydroxyzine- less - 1/2 tab 1-2 days per month.  Would like to stay on same dose sertraline at this time.  No current counseling.      04/27/2023    9:50 AM 10/20/2022    9:22 AM 06/09/2022   10:15 AM 05/08/2022    9:15 AM 04/21/2022   10:48 AM  Depression screen PHQ 2/9  Decreased Interest 1 1 1 3 3   Down, Depressed, Hopeless 1 1 1 3 3   PHQ - 2 Score 2 2 2 6 6   Altered sleeping 1 2 1 3 3   Tired, decreased energy 1 2 2 3 3   Change in appetite 1 2 2 3 3   Feeling bad or failure about yourself  1 1 1 2 2   Trouble concentrating 1 1 1  0 0  Moving slowly or fidgety/restless 0 1 0 0 0  Suicidal thoughts 0 0 1 1 2   PHQ-9 Score 7 11 10 18 19   Difficult doing work/chores Not difficult at all    Extremely dIfficult    Health Maintenance  Topic Date Due   Colonoscopy  Never done   PAP SMEAR-Modifier  12/08/2022   Zoster Vaccines- Shingrix (2 of 2) 12/15/2022   INFLUENZA VACCINE   03/12/2023   COVID-19 Vaccine (4 - 2023-24 season) 04/12/2023   MAMMOGRAM  07/24/2023   DTaP/Tdap/Td (2 - Td or Tdap) 05/15/2029   Hepatitis C Screening  Completed   HIV Screening  Completed   HPV VACCINES  Aged Out  Colon CA screening - Screening options with colonoscopy versus Cologuard discussed. Discussed timing of repeat testing intervals if normal, as well as potential need for diagnostic Colonoscopy if positive Cologuard. Understanding expressed, and chose Cologuard prior - has at home, has not submitted.  Mammogram 07/2021. Plans to make appointment.  Pap testing in 2021- negative and negative HPV testing.   Immunization History  Administered Date(s) Administered   Influenza,inj,Quad PF,6+ Mos 07/15/2015, 05/16/2019, 06/08/2020, 05/08/2022   PFIZER(Purple Top)SARS-COV-2 Vaccination 10/20/2019, 11/14/2019, 06/27/2020   Respiratory Syncytial Virus Vaccine,Recomb Aduvanted(Arexvy) 07/21/2022   Tdap 05/16/2019   Zoster Recombinant(Shingrix) 10/20/2022  Flu vaccine today.  2nd shingrix today.  Covid booster recommended at pharmacy.   Vision Screening   Right eye Left eye Both eyes  Without correction 20/13 20/20 20/13   With correction     Chronic floaters that have been evaluated by ophthalmology, denies recent changes  Dental: overdue. Recommended to schedule.   Alcohol:  occasional - 2/month.   Tobacco: none  Exercise: minimal walking in house. Recommended. Walking during work and some lifting.   Lab Results  Component Value Date   CHOL 225 (H) 10/20/2022   HDL 67.80 10/20/2022   LDLCALC 139 (H) 10/20/2022   TRIG 91.0 10/20/2022   CHOLHDL 3 10/20/2022     History Patient Active Problem List   Diagnosis Date Noted   Essential hypertension 10/20/2022   Psychophysiological insomnia 10/20/2022   Hyperlipidemia 10/20/2022   Depression with anxiety 10/20/2022   Osteoporosis without current pathological fracture 10/20/2022   Seasonal allergies 12/09/2013    Osteopenia 12/09/2013   Past Medical History:  Diagnosis Date   Allergy    Anxiety    Arthritis    Cataract    Depression    GERD (gastroesophageal reflux disease)    Hypertension    Osteoporosis    Peptic ulcer    Pneumonia    Past Surgical History:  Procedure Laterality Date   ADENOIDECTOMY     BREAST EXCISIONAL BIOPSY Left    2008 tumor removed - benign   CATARACT EXTRACTION, BILATERAL Bilateral 2021   MELANOMA EXCISION Right 1985   hand   PARTIAL HYSTERECTOMY  1995   with tubal ligation   TUBAL LIGATION     Allergies  Allergen Reactions   Erythromycin Nausea And Vomiting   Iodine Rash    Pt states this is topical betadine   Prior to Admission medications   Medication Sig Start Date End Date Taking? Authorizing Provider  amLODipine (NORVASC) 5 MG tablet TAKE 1 TABLET(5 MG) BY MOUTH DAILY 04/14/23  Yes Shade Flood, MD  Calcium Carb-Cholecalciferol (CALCIUM 500 + D PO) Take 1 tablet by mouth daily.   Yes [provider]  fexofenadine (ALLEGRA) 60 MG tablet Take 60 mg by mouth 2 (two) times daily.   Yes [provider]  fluticasone (FLONASE) 50 MCG/ACT nasal spray Place into the nose.   Yes [provider]  FLUTICASONE FUROATE IN Inhale into the lungs.   Yes [provider]  hydrOXYzine (ATARAX) 25 MG tablet Take 0.5-1 tablets (12.5-25 mg total) by mouth at bedtime as needed. 10/20/22  Yes Shade Flood, MD  Melatonin 1 MG CHEW Chew 1.5 % by mouth.   Yes [provider]  omeprazole (PRILOSEC) 10 MG capsule Take 10 mg by mouth daily.   Yes [provider]  ondansetron (ZOFRAN) 4 MG tablet Take 1 tablet (4 mg total) by mouth every 8 (eight) hours as needed for nausea or vomiting. 04/21/22  Yes Shade Flood, MD  sertraline (ZOLOFT) 50 MG tablet TAKE 1 TABLET(50 MG) BY MOUTH DAILY 07/21/22  Yes Shade Flood, MD  zoledronic acid (RECLAST) 5 MG/100ML SOLN injection Inject 5 mg into the vein. yearly   Yes  [provider]   Social History   Socioeconomic History   Marital status: Married    Spouse name: Not on file   Number of children: 2   Years of education: Not on file   Highest education level: Not on file  Occupational History   Occupation: Dept manager/ [employer]  Tobacco Use   Smoking status: Former    Current packs/day: 0.00    Types: Cigarettes    Quit date: 1988    Years since quitting: 36.7   Smokeless tobacco: Never  Vaping Use   Vaping status: Never Used  Substance and Sexual Activity   Alcohol use: Yes    Comment: occasional  Drug use: Never   Sexual activity: Not on file  Other Topics Concern   Not on file  Social History Narrative   Not on file   Social Determinants of Health   Financial Resource Strain: Not on file  Food Insecurity: Not on file  Transportation Needs: Not on file  Physical Activity: Not on file  Stress: Not on file  Social Connections: Not on file  Intimate Partner Violence: Not on file    Review of Systems 13 point review of systems per patient health survey noted.  Negative other than as indicated above or in HPI.    Objective:   Vitals:   04/27/23 0942  BP: 122/74  Pulse: 79  Temp: 98.2 F (36.8 C)  TempSrc: Temporal  SpO2: 97%  Weight: 143 lb 9.6 oz (65.1 kg)  Height: 5\' 4"  (1.626 m)   {Vitals History (Optional):23777}  Physical Exam Constitutional:      Appearance: She is well-developed.  HENT:     Head: Normocephalic and atraumatic.     Right Ear: External ear normal.     Left Ear: External ear normal.  Eyes:     Conjunctiva/sclera: Conjunctivae normal.     Pupils: Pupils are equal, round, and reactive to light.  Neck:     Thyroid: No thyromegaly.  Cardiovascular:     Rate and Rhythm: Normal rate and regular rhythm.     Heart sounds: Normal heart sounds. No murmur heard. Pulmonary:     Effort: Pulmonary effort is normal. No respiratory distress.     Breath sounds: Normal breath sounds. No  wheezing.  Abdominal:     General: Bowel sounds are normal.     Palpations: Abdomen is soft.     Tenderness: There is no abdominal tenderness.  Musculoskeletal:        General: No tenderness. Normal range of motion.     Cervical back: Normal range of motion and neck supple.  Lymphadenopathy:     Cervical: No cervical adenopathy.  Skin:    General: Skin is warm and dry.     Findings: No rash.  Neurological:     Mental Status: She is alert and oriented to person, place, and time.  Psychiatric:        Behavior: Behavior normal.        Thought Content: Thought content normal.        Assessment & Plan:  MYEASHA HOOPES is a 62 y.o. female . Annual physical exam  Hyperlipidemia, unspecified hyperlipidemia type - Plan: Comprehensive metabolic panel, Lipid panel  Depression with anxiety  Osteoporosis without current pathological fracture, unspecified osteoporosis type  Essential hypertension - Plan: Comprehensive metabolic panel  Epigastric pain - Plan: sertraline (ZOLOFT) 50 MG tablet  Needs flu shot - Plan: Flu vaccine trivalent PF, 6mos and older(Flulaval,Afluria,Fluarix,Fluzone)  Need for shingles vaccine - Plan: Varicella-zoster vaccine IM   Meds ordered this encounter  Medications   sertraline (ZOLOFT) 50 MG tablet    Sig: TAKE 1 TABLET(50 MG) BY MOUTH DAILY    Dispense:  90 tablet    Refill:  2   Patient Instructions  Place the cologuard box within sight in the bathroom and check expiration date. I can order a new one if it is expired. Try to complete that test this week.  Schedule mammogram.  I recommend scheduling dental visit.  Once labs return we can send those to your osteoporosis provider. Thank you for coming in today and take care.  Preventive Care 40-64  Years Old, Female Preventive care refers to lifestyle choices and visits with your health care provider that can promote health and wellness. Preventive care visits are also called wellness exams. What  can I expect for my preventive care visit? Counseling Your health care provider may ask you questions about your: Medical history, including: Past medical problems. Family medical history. Pregnancy history. Current health, including: Menstrual cycle. Method of birth control. Emotional well-being. Home life and relationship well-being. Sexual activity and sexual health. Lifestyle, including: Alcohol, nicotine or tobacco, and drug use. Access to firearms. Diet, exercise, and sleep habits. Work and work Astronomer. Sunscreen use. Safety issues such as seatbelt and bike helmet use. Physical exam Your health care provider will check your: Height and weight. These may be used to calculate your BMI (body mass index). BMI is a measurement that tells if you are at a healthy weight. Waist circumference. This measures the distance around your waistline. This measurement also tells if you are at a healthy weight and may help predict your risk of certain diseases, such as type 2 diabetes and high blood pressure. Heart rate and blood pressure. Body temperature. Skin for abnormal spots. What immunizations do I need?  Vaccines are usually given at various ages, according to a schedule. Your health care provider will recommend vaccines for you based on your age, medical history, and lifestyle or other factors, such as travel or where you work. What tests do I need? Screening Your health care provider may recommend screening tests for certain conditions. This may include: Lipid and cholesterol levels. Diabetes screening. This is done by checking your blood sugar (glucose) after you have not eaten for a while (fasting). Pelvic exam and Pap test. Hepatitis B test. Hepatitis C test. HIV (human immunodeficiency virus) test. STI (sexually transmitted infection) testing, if you are at risk. Lung cancer screening. Colorectal cancer screening. Mammogram. Talk with your health care provider about  when you should start having regular mammograms. This may depend on whether you have a family history of breast cancer. BRCA-related cancer screening. This may be done if you have a family history of breast, ovarian, tubal, or peritoneal cancers. Bone density scan. This is done to screen for osteoporosis. Talk with your health care provider about your test results, treatment options, and if necessary, the need for more tests. Follow these instructions at home: Eating and drinking  Eat a diet that includes fresh fruits and vegetables, whole grains, lean protein, and low-fat dairy products. Take vitamin and mineral supplements as recommended by your health care provider. Do not drink alcohol if: Your health care provider tells you not to drink. You are pregnant, may be pregnant, or are planning to become pregnant. If you drink alcohol: Limit how much you have to 0-1 drink a day. Know how much alcohol is in your drink. In the [place] one drink equals one 12 oz bottle of beer (355 mL), one 5 oz glass of wine (148 mL), or one 1 oz glass of hard liquor (44 mL). Lifestyle Brush your teeth every morning and night with fluoride toothpaste. Floss one time each day. Exercise for at least 30 minutes 5 or more days each week. Do not use any products that contain nicotine or tobacco. These products include cigarettes, chewing tobacco, and vaping devices, such as e-cigarettes. If you need help quitting, ask your health care provider. Do not use drugs. If you are sexually active, practice safe sex. Use a condom or other form of protection to prevent STIs.  If you do not wish to become pregnant, use a form of birth control. If you plan to become pregnant, see your health care provider for a prepregnancy visit. Take aspirin only as told by your health care provider. Make sure that you understand how much to take and what form to take. Work with your health care provider to find out whether it is safe and  beneficial for you to take aspirin daily. Find healthy ways to manage stress, such as: Meditation, yoga, or listening to music. Journaling. Talking to a trusted person. Spending time with friends and family. Minimize exposure to UV radiation to reduce your risk of skin cancer. Safety Always wear your seat belt while driving or riding in a vehicle. Do not drive: If you have been drinking alcohol. Do not ride with someone who has been drinking. When you are tired or distracted. While texting. If you have been using any mind-altering substances or drugs. Wear a helmet and other protective equipment during sports activities. If you have firearms in your house, make sure you follow all gun safety procedures. Seek help if you have been physically or sexually abused. What's next? Visit your health care provider once a year for an annual wellness visit. Ask your health care provider how often you should have your eyes and teeth checked. Stay up to date on all vaccines. This information is not intended to replace advice given to you by your health care provider. Make sure you discuss any questions you have with your health care provider. Document Revised: 01/23/2021 Document Reviewed: 01/23/2021 Elsevier Patient Education  2024 Elsevier Inc.        Signed,   Meredith Staggers, MD [facility], [facility] 04/27/23 10:35 AM

## 2023-04-27 NOTE — Patient Instructions (Addendum)
Place the cologuard box within sight in the bathroom and check expiration date. I can order a new one if it is expired. Try to complete that test this week.  Schedule mammogram.  I recommend scheduling dental visit.  Once labs return we can send those to your osteoporosis provider. Thank you for coming in today and take care.  Preventive Care 10-64 Years Old, Female Preventive care refers to lifestyle choices and visits with your health care provider that can promote health and wellness. Preventive care visits are also called wellness exams. What can I expect for my preventive care visit? Counseling Your health care provider may ask you questions about your: Medical history, including: Past medical problems. Family medical history. Pregnancy history. Current health, including: Menstrual cycle. Method of birth control. Emotional well-being. Home life and relationship well-being. Sexual activity and sexual health. Lifestyle, including: Alcohol, nicotine or tobacco, and drug use. Access to firearms. Diet, exercise, and sleep habits. Work and work Astronomer. Sunscreen use. Safety issues such as seatbelt and bike helmet use. Physical exam Your health care provider will check your: Height and weight. These may be used to calculate your BMI (body mass index). BMI is a measurement that tells if you are at a healthy weight. Waist circumference. This measures the distance around your waistline. This measurement also tells if you are at a healthy weight and may help predict your risk of certain diseases, such as type 2 diabetes and high blood pressure. Heart rate and blood pressure. Body temperature. Skin for abnormal spots. What immunizations do I need?  Vaccines are usually given at various ages, according to a schedule. Your health care provider will recommend vaccines for you based on your age, medical history, and lifestyle or other factors, such as travel or where you work. What  tests do I need? Screening Your health care provider may recommend screening tests for certain conditions. This may include: Lipid and cholesterol levels. Diabetes screening. This is done by checking your blood sugar (glucose) after you have not eaten for a while (fasting). Pelvic exam and Pap test. Hepatitis B test. Hepatitis C test. HIV (human immunodeficiency virus) test. STI (sexually transmitted infection) testing, if you are at risk. Lung cancer screening. Colorectal cancer screening. Mammogram. Talk with your health care provider about when you should start having regular mammograms. This may depend on whether you have a family history of breast cancer. BRCA-related cancer screening. This may be done if you have a family history of breast, ovarian, tubal, or peritoneal cancers. Bone density scan. This is done to screen for osteoporosis. Talk with your health care provider about your test results, treatment options, and if necessary, the need for more tests. Follow these instructions at home: Eating and drinking  Eat a diet that includes fresh fruits and vegetables, whole grains, lean protein, and low-fat dairy products. Take vitamin and mineral supplements as recommended by your health care provider. Do not drink alcohol if: Your health care provider tells you not to drink. You are pregnant, may be pregnant, or are planning to become pregnant. If you drink alcohol: Limit how much you have to 0-1 drink a day. Know how much alcohol is in your drink. In the [place] one drink equals one 12 oz bottle of beer (355 mL), one 5 oz glass of wine (148 mL), or one 1 oz glass of hard liquor (44 mL). Lifestyle Brush your teeth every morning and night with fluoride toothpaste. Floss one time each day. Exercise for at least  30 minutes 5 or more days each week. Do not use any products that contain nicotine or tobacco. These products include cigarettes, chewing tobacco, and vaping devices, such as  e-cigarettes. If you need help quitting, ask your health care provider. Do not use drugs. If you are sexually active, practice safe sex. Use a condom or other form of protection to prevent STIs. If you do not wish to become pregnant, use a form of birth control. If you plan to become pregnant, see your health care provider for a prepregnancy visit. Take aspirin only as told by your health care provider. Make sure that you understand how much to take and what form to take. Work with your health care provider to find out whether it is safe and beneficial for you to take aspirin daily. Find healthy ways to manage stress, such as: Meditation, yoga, or listening to music. Journaling. Talking to a trusted person. Spending time with friends and family. Minimize exposure to UV radiation to reduce your risk of skin cancer. Safety Always wear your seat belt while driving or riding in a vehicle. Do not drive: If you have been drinking alcohol. Do not ride with someone who has been drinking. When you are tired or distracted. While texting. If you have been using any mind-altering substances or drugs. Wear a helmet and other protective equipment during sports activities. If you have firearms in your house, make sure you follow all gun safety procedures. Seek help if you have been physically or sexually abused. What's next? Visit your health care provider once a year for an annual wellness visit. Ask your health care provider how often you should have your eyes and teeth checked. Stay up to date on all vaccines. This information is not intended to replace advice given to you by your health care provider. Make sure you discuss any questions you have with your health care provider. Document Revised: 01/23/2021 Document Reviewed: 01/23/2021 Elsevier Patient Education  2024 [employer].

## 2023-04-29 ENCOUNTER — Encounter: Payer: Self-pay | Admitting: Family Medicine

## 2023-06-26 DIAGNOSIS — M24549 Contracture, unspecified hand: Secondary | ICD-10-CM | POA: Insufficient documentation

## 2023-07-29 DIAGNOSIS — S62629A Displaced fracture of medial phalanx of unspecified finger, initial encounter for closed fracture: Secondary | ICD-10-CM | POA: Insufficient documentation

## 2023-08-17 DIAGNOSIS — M79644 Pain in right finger(s): Secondary | ICD-10-CM | POA: Insufficient documentation

## 2023-10-04 ENCOUNTER — Other Ambulatory Visit: Payer: Self-pay | Admitting: Family Medicine

## 2023-10-04 DIAGNOSIS — I10 Essential (primary) hypertension: Secondary | ICD-10-CM

## 2023-10-07 ENCOUNTER — Telehealth: Payer: Self-pay

## 2023-10-07 ENCOUNTER — Other Ambulatory Visit: Payer: Self-pay | Admitting: Family Medicine

## 2023-10-07 DIAGNOSIS — I10 Essential (primary) hypertension: Secondary | ICD-10-CM

## 2023-10-07 NOTE — Telephone Encounter (Signed)
 Pt has been notified this rx has been filled 10/07/2023 90 day w/ 1 refill

## 2023-10-07 NOTE — Telephone Encounter (Signed)
 Copied from CRM [id number]. Topic: Clinical - Prescription Issue >> Oct 07, 2023  3:08 PM Jenny Davidson wrote: Reason for CRM:   Patient is calling in regards to her amlodipine refill. She was advised by [facility] that the request was denied by the clinic. Advised she had a remaining refill for the medication in our system and she notes she was told she did not have any more refills. Requesting call back if possible  CB# [id number]

## 2023-10-26 ENCOUNTER — Ambulatory Visit: Payer: 59 | Admitting: Family Medicine

## 2023-11-23 ENCOUNTER — Ambulatory Visit: Admitting: Family Medicine

## 2023-12-14 ENCOUNTER — Ambulatory Visit: Admitting: Family Medicine

## 2024-01-05 DIAGNOSIS — M25649 Stiffness of unspecified hand, not elsewhere classified: Secondary | ICD-10-CM | POA: Insufficient documentation

## 2024-01-11 ENCOUNTER — Ambulatory Visit (INDEPENDENT_AMBULATORY_CARE_PROVIDER_SITE_OTHER): Admitting: Family Medicine

## 2024-01-11 ENCOUNTER — Encounter: Payer: Self-pay | Admitting: Family Medicine

## 2024-01-11 VITALS — BP 132/86 | HR 81 | Temp 98.2°F | Ht 63.0 in | Wt 154.0 lb

## 2024-01-11 DIAGNOSIS — I10 Essential (primary) hypertension: Secondary | ICD-10-CM

## 2024-01-11 DIAGNOSIS — F418 Other specified anxiety disorders: Secondary | ICD-10-CM

## 2024-01-11 DIAGNOSIS — F5104 Psychophysiologic insomnia: Secondary | ICD-10-CM | POA: Diagnosis not present

## 2024-01-11 DIAGNOSIS — R1013 Epigastric pain: Secondary | ICD-10-CM | POA: Diagnosis not present

## 2024-01-11 DIAGNOSIS — Z1231 Encounter for screening mammogram for malignant neoplasm of breast: Secondary | ICD-10-CM | POA: Diagnosis not present

## 2024-01-11 DIAGNOSIS — E785 Hyperlipidemia, unspecified: Secondary | ICD-10-CM

## 2024-01-11 LAB — COMPREHENSIVE METABOLIC PANEL WITH GFR
ALT: 15 U/L (ref 0–35)
AST: 22 U/L (ref 0–37)
Albumin: 4.7 g/dL (ref 3.5–5.2)
Alkaline Phosphatase: 72 U/L (ref 39–117)
BUN: 12 mg/dL (ref 6–23)
CO2: 27 meq/L (ref 19–32)
Calcium: 9.8 mg/dL (ref 8.4–10.5)
Chloride: 105 meq/L (ref 96–112)
Creatinine, Ser: 0.78 mg/dL (ref 0.40–1.20)
GFR: 79.86 mL/min (ref >60.00)
Glucose, Bld: 95 mg/dL (ref 70–99)
Potassium: 5.2 meq/L — ABNORMAL HIGH (ref 3.5–5.1)
Sodium: 140 meq/L (ref 135–145)
Total Bilirubin: 0.5 mg/dL (ref 0.2–1.2)
Total Protein: 7.5 g/dL (ref 6.0–8.3)

## 2024-01-11 LAB — LIPID PANEL
Cholesterol: 255 mg/dL — ABNORMAL HIGH (ref 0–200)
HDL: 64.7 mg/dL (ref >39.00)
LDL Cholesterol: 167 mg/dL — ABNORMAL HIGH (ref 0–99)
NonHDL: 189.99
Total CHOL/HDL Ratio: 4
Triglycerides: 116 mg/dL (ref 0.0–149.0)
VLDL: 23.2 mg/dL (ref 0.0–40.0)

## 2024-01-11 MED ORDER — HYDROXYZINE HCL 25 MG PO TABS
12.5000 mg | ORAL_TABLET | Freq: Every evening | ORAL | 1 refills | Status: AC | PRN
Start: 2024-01-11 — End: ?

## 2024-01-11 MED ORDER — AMLODIPINE BESYLATE 5 MG PO TABS
ORAL_TABLET | ORAL | 2 refills | Status: AC
Start: 2024-01-11 — End: ?

## 2024-01-11 MED ORDER — SERTRALINE HCL 50 MG PO TABS: ORAL_TABLET | ORAL | 2 refills | Status: DC

## 2024-01-11 NOTE — Patient Instructions (Addendum)
 No change in meds today. See info on blood pressure below and let me know if there are changes/questions.  I will check some screening labs including cholesterol that was slightly elevated in the past.  Exercise changes, diet changes would be appropriate initial approach but will follow-up in 6 months.  Remember the start low/go slow approach for exercise with ultimate goal of 150 minutes/week.  If you do notice more depression/mood symptoms you can increase by half pill of sertraline  per day.  Let me know if you want to make that change.  Take care!   Managing Your Hypertension Hypertension, also called high blood pressure, is when the force of the blood pressing against the walls of the arteries is too strong. Arteries are blood vessels that carry blood from your heart throughout your body. Hypertension forces the heart to work harder to pump blood and may cause the arteries to become narrow or stiff. Understanding blood pressure readings A blood pressure reading includes a higher number over a lower number: The first, or top, number is called the systolic pressure. It is a measure of the pressure in your arteries as your heart beats. The second, or bottom number, is called the diastolic pressure. It is a measure of the pressure in your arteries as the heart relaxes. For most people, a normal blood pressure is below 120/80. Your personal target blood pressure may vary depending on your medical conditions, your age, and other factors. Blood pressure is classified into four stages. Based on your blood pressure reading, your health care provider may use the following stages to determine what type of treatment you need, if any. Systolic pressure and diastolic pressure are measured in a unit called millimeters of mercury (mmHg). Normal Systolic pressure: below 120. Diastolic pressure: below 80. Elevated Systolic pressure: 120-129. Diastolic pressure: below 80. Hypertension stage 1 Systolic  pressure: 130-139. Diastolic pressure: 80-89. Hypertension stage 2 Systolic pressure: 140 or above. Diastolic pressure: 90 or above. How can this condition affect me? Managing your hypertension is very important. Over time, hypertension can damage the arteries and decrease blood flow to parts of the body, including the brain, heart, and kidneys. Having untreated or uncontrolled hypertension can lead to: A heart attack. A stroke. A weakened blood vessel (aneurysm). Heart failure. Kidney damage. Eye damage. Memory and concentration problems. Vascular dementia. What actions can I take to manage this condition? Hypertension can be managed by making lifestyle changes and possibly by taking medicines. Your health care provider will help you make a plan to bring your blood pressure within a normal range. You may be referred for counseling on a healthy diet and physical activity. Nutrition  Eat a diet that is high in fiber and potassium, and low in salt (sodium), added sugar, and fat. An example eating plan is called the DASH diet. DASH stands for Dietary Approaches to Stop Hypertension. To eat this way: Eat plenty of fresh fruits and vegetables. Try to fill one-half of your plate at each meal with fruits and vegetables. Eat whole grains, such as whole-wheat pasta, brown rice, or whole-grain bread. Fill about one-fourth of your plate with whole grains. Eat low-fat dairy products. Avoid fatty cuts of meat, processed or cured meats, and poultry with skin. Fill about one-fourth of your plate with lean proteins such as fish, chicken without skin, beans, eggs, and tofu. Avoid pre-made and processed foods. These tend to be higher in sodium, added sugar, and fat. Reduce your daily sodium intake. Many people with hypertension  should eat less than 1,500 mg of sodium a day. Lifestyle  Work with your health care provider to maintain a healthy body weight or to lose weight. Ask what an ideal weight is for  you. Get at least 30 minutes of exercise that causes your heart to beat faster (aerobic exercise) most days of the week. Activities may include walking, swimming, or biking. Include exercise to strengthen your muscles (resistance exercise), such as weight lifting, as part of your weekly exercise routine. Try to do these types of exercises for 30 minutes at least 3 days a week. Do not use any products that contain nicotine or tobacco. These products include cigarettes, chewing tobacco, and vaping devices, such as e-cigarettes. If you need help quitting, ask your health care provider. Control any long-term (chronic) conditions you have, such as high cholesterol or diabetes. Identify your sources of stress and find ways to manage stress. This may include meditation, deep breathing, or making time for fun activities. Alcohol use Do not drink alcohol if: Your health care provider tells you not to drink. You are pregnant, may be pregnant, or are planning to become pregnant. If you drink alcohol: Limit how much you have to: 0-1 drink a day for women. 0-2 drinks a day for men. Know how much alcohol is in your drink. In the [place] one drink equals one 12 oz bottle of beer (355 mL), one 5 oz glass of wine (148 mL), or one 1 oz glass of hard liquor (44 mL). Medicines Your health care provider may prescribe medicine if lifestyle changes are not enough to get your blood pressure under control and if: Your systolic blood pressure is 130 or higher. Your diastolic blood pressure is 80 or higher. Take medicines only as told by your health care provider. Follow the directions carefully. Blood pressure medicines must be taken as told by your health care provider. The medicine does not work as well when you skip doses. Skipping doses also puts you at risk for problems. Monitoring Before you monitor your blood pressure: Do not smoke, drink caffeinated beverages, or exercise within 30 minutes before taking a  measurement. Use the bathroom and empty your bladder (urinate). Sit quietly for at least 5 minutes before taking measurements. Monitor your blood pressure at home as told by your health care provider. To do this: Sit with your back straight and supported. Place your feet flat on the floor. Do not cross your legs. Support your arm on a flat surface, such as a table. Make sure your upper arm is at heart level. Each time you measure, take two or three readings one minute apart and record the results. You may also need to have your blood pressure checked regularly by your health care provider. General information Talk with your health care provider about your diet, exercise habits, and other lifestyle factors that may be contributing to hypertension. Review all the medicines you take with your health care provider because there may be side effects or interactions. Keep all follow-up visits. Your health care provider can help you create and adjust your plan for managing your high blood pressure. Where to find more information National Heart, Lung, and [employer] Institute: [employer] American Heart Association: www.heart.org Contact a health care provider if: You think you are having a reaction to medicines you have taken. You have repeated (recurrent) headaches. You feel dizzy. You have swelling in your ankles. You have trouble with your vision. Get help right away if: You develop a severe headache  or confusion. You have unusual weakness or numbness, or you feel faint. You have severe pain in your chest or abdomen. You vomit repeatedly. You have trouble breathing. These symptoms may be an emergency. Get help right away. Call 911. Do not wait to see if the symptoms will go away. Do not drive yourself to the hospital. Summary Hypertension is when the force of blood pumping through your arteries is too strong. If this condition is not controlled, it may put you at risk for serious  complications. Your personal target blood pressure may vary depending on your medical conditions, your age, and other factors. For most people, a normal blood pressure is less than 120/80. Hypertension is managed by lifestyle changes, medicines, or both. Lifestyle changes to help manage hypertension include losing weight, eating a healthy, low-sodium diet, exercising more, stopping smoking, and limiting alcohol. This information is not intended to replace advice given to you by your health care provider. Make sure you discuss any questions you have with your health care provider. Document Revised: 04/11/2021 Document Reviewed: 04/11/2021 [employer] Patient Education  2024 [employer].

## 2024-01-11 NOTE — Progress Notes (Signed)
 Subjective:  Patient ID: Jenny Davidson, female    DOB: 01-17-1961  Age: 63 y.o. MRN: [id number]  CC:  Chief Complaint  Patient presents with   Follow-up    HPI Jenny Davidson presents for   Follow-up.  Physical in September. Osteoporosis/osteopenia followed by [facility].  Treated with Reclast.  Improved readings in 04/09/2019 for bone density testing. Mammogram due, referral placed.  Hypertension: Amlodipine  5 mg daily. No new side effects.  No new symptoms. Floaters discussed with optho - no changes.  Min added salt.  Home readings: none.  BP Readings from Last 3 Encounters:  01/11/24 132/86  04/27/23 122/74  02/26/23 130/80   Lab Results  Component Value Date   CREATININE 0.68 04/27/2023   Hyperlipidemia: No current meds, has gained a little bit of weight with less activity/exercise, plans to increase. FITT principle discussed, as well as start low/go slow approach.   Wt Readings from Last 3 Encounters:  01/11/24 154 lb (69.9 kg)  04/27/23 143 lb 9.6 oz (65.1 kg)  02/26/23 130 lb (59 kg)    Lab Results  Component Value Date   CHOL 223 (H) 04/27/2023   HDL 55.90 04/27/2023   LDLCALC 130 (H) 04/27/2023   TRIG 184.0 (H) 04/27/2023   CHOLHDL 4 04/27/2023   Lab Results  Component Value Date   ALT 13 04/27/2023   AST 20 04/27/2023   ALKPHOS 69 04/27/2023   BILITOT 0.5 04/27/2023    Depression: Treated with sertraline  50 mg daily.  Intermittent need for hydroxyzine  for sleep - no recent need. Doing better - now with [address] [place]. Background in jewelry. Doing well.       01/11/2024    9:41 AM 04/27/2023    9:50 AM 10/20/2022    9:22 AM 06/09/2022   10:15 AM 05/08/2022    9:15 AM  Depression screen PHQ 2/9  Decreased Interest 1 1 1 1 3   Down, Depressed, Hopeless 1 1 1 1 3   PHQ - 2 Score 2 2 2 2 6   Altered sleeping 1 1 2 1 3   Tired, decreased energy 1 1 2 2 3   Change in appetite 1 1 2 2 3   Feeling bad or failure about  yourself  1 1 1 1 2   Trouble concentrating 1 1 1 1  0  Moving slowly or fidgety/restless 1 0 1 0 0  Suicidal thoughts 0 0 0 1 1  PHQ-9 Score 8 7 11 10 18   Difficult doing work/chores Somewhat difficult Not difficult at all           History Patient Active Problem List   Diagnosis Date Noted   Essential hypertension 10/20/2022   Psychophysiological insomnia 10/20/2022   Hyperlipidemia 10/20/2022   Depression with anxiety 10/20/2022   Osteoporosis without current pathological fracture 10/20/2022   Seasonal allergies 12/09/2013   Osteopenia 12/09/2013   Past Medical History:  Diagnosis Date   Allergy    Anxiety    Arthritis    Cataract    Depression    GERD (gastroesophageal reflux disease)    Hypertension    Osteoporosis    Peptic ulcer    Pneumonia    Past Surgical History:  Procedure Laterality Date   ADENOIDECTOMY     BREAST EXCISIONAL BIOPSY Left    2008 tumor removed - benign   CATARACT EXTRACTION, BILATERAL Bilateral 2021   MELANOMA EXCISION Right 1985   hand   PARTIAL HYSTERECTOMY  1995   with  tubal ligation   TUBAL LIGATION     Allergies  Allergen Reactions   Erythromycin Nausea And Vomiting   Iodine Rash    Pt states this is topical betadine   Prior to Admission medications   Medication Sig Start Date End Date Taking? Authorizing Provider  amLODipine  (NORVASC ) 5 MG tablet TAKE 1 TABLET(5 MG) BY MOUTH DAILY 10/07/23  Yes Benjiman Bras, MD  Calcium Carb-Cholecalciferol (CALCIUM 500 + D PO) Take 1 tablet by mouth daily.   Yes [provider]  fexofenadine (ALLEGRA) 60 MG tablet Take 60 mg by mouth 2 (two) times daily.   Yes [provider]  fluticasone  (FLONASE ) 50 MCG/ACT nasal spray Place into the nose.   Yes [provider]  FLUTICASONE  FUROATE IN Inhale into the lungs.   Yes [provider]  HYDROcodone -acetaminophen  (NORCO/VICODIN) 5-325 MG tablet Take 1 tablet by mouth every 8 (eight) hours as needed. 12/28/23   Yes [provider]  hydrOXYzine  (ATARAX ) 25 MG tablet Take 0.5-1 tablets (12.5-25 mg total) by mouth at bedtime as needed. 10/20/22  Yes Benjiman Bras, MD  omeprazole  (PRILOSEC) 10 MG capsule Take 10 mg by mouth daily.   Yes [provider]  ondansetron  (ZOFRAN ) 4 MG tablet Take 1 tablet (4 mg total) by mouth every 8 (eight) hours as needed for nausea or vomiting. 04/21/22  Yes Benjiman Bras, MD  sertraline  (ZOLOFT ) 50 MG tablet TAKE 1 TABLET(50 MG) BY MOUTH DAILY 04/27/23  Yes Benjiman Bras, MD  zoledronic acid (RECLAST) 5 MG/100ML SOLN injection Inject 5 mg into the vein. yearly   Yes [provider]  Melatonin 1 MG CHEW Chew 1.5 % by mouth. Patient not taking: Reported on 01/11/2024    [provider]   Social History   Socioeconomic History   Marital status: Married    Spouse name: Not on file   Number of children: 2   Years of education: Not on file   Highest education level: Some college, no degree  Occupational History   Occupation: Dept Administrator, arts  Tobacco Use   Smoking status: Former    Current packs/day: 0.00    Types: Cigarettes    Quit date: 1988    Years since quitting: 37.4   Smokeless tobacco: Never  Vaping Use   Vaping status: Never Used  Substance and Sexual Activity   Alcohol use: Yes    Comment: occasional   Drug use: Never   Sexual activity: Not on file  Other Topics Concern   Not on file  Social History Narrative   Not on file   Social Drivers of Health   Financial Resource Strain: Low Risk  (01/10/2024)   Overall Financial Resource Strain (CARDIA)    Difficulty of Paying Living Expenses: Not very hard  Food Insecurity: No Food Insecurity (01/10/2024)   Hunger Vital Sign    Worried About Running Out of Food in the Last Year: Never true    Ran Out of Food in the Last Year: Never true  Transportation Needs: No Transportation Needs (01/10/2024)   PRAPARE - Administrator, Civil Service  (Medical): No    Lack of Transportation (Non-Medical): No  Physical Activity: Not on file  Stress: No Stress Concern Present (01/10/2024)   [employer] of Occupational Health - Occupational Stress Questionnaire    Feeling of Stress : Only a little  Social Connections: Moderately Isolated (01/10/2024)   Social Connection and Isolation Panel [NHANES]  Frequency of Communication with Friends and Family: Three times a week    Frequency of Social Gatherings with Friends and Family: Twice a week    Attends Religious Services: Never    Database administrator or Organizations: No    Attends Engineer, structural: Not on file    Marital Status: Married  Catering manager Violence: Not on file    Review of Systems  Constitutional:  Negative for fatigue and unexpected weight change.  Respiratory:  Negative for chest tightness and shortness of breath.   Cardiovascular:  Negative for chest pain, palpitations and leg swelling.  Gastrointestinal:  Negative for abdominal pain and blood in stool.  Neurological:  Negative for dizziness, syncope, light-headedness and headaches.     Objective:   Vitals:   01/11/24 0904  BP: 132/86  Pulse: 81  Temp: 98.2 F (36.8 C)  TempSrc: Oral  SpO2: 94%  Weight: 154 lb (69.9 kg)  Height: 5\' 3"  (1.6 m)     Physical Exam Vitals reviewed.  Constitutional:      Appearance: Normal appearance. She is well-developed.  HENT:     Head: Normocephalic and atraumatic.  Eyes:     Conjunctiva/sclera: Conjunctivae normal.     Pupils: Pupils are equal, round, and reactive to light.  Neck:     Vascular: No carotid bruit.  Cardiovascular:     Rate and Rhythm: Normal rate and regular rhythm.     Heart sounds: Normal heart sounds.  Pulmonary:     Effort: Pulmonary effort is normal.     Breath sounds: Normal breath sounds.  Abdominal:     Palpations: Abdomen is soft. There is no pulsatile mass.     Tenderness: There is no abdominal tenderness.   Musculoskeletal:     Right lower leg: No edema.     Left lower leg: No edema.  Skin:    General: Skin is warm and dry.  Neurological:     Mental Status: She is alert and oriented to person, place, and time.  Psychiatric:        Mood and Affect: Mood normal.        Behavior: Behavior normal.        Assessment & Plan:  Jenny Davidson is a 63 y.o. female . Essential hypertension - Plan: amLODipine  (NORVASC ) 5 MG tablet, Comprehensive metabolic panel with GFR  - Borderline control, has had slight weight gain.  Diet/exercise approach discussed initially with start low/go slow approach and ideal goal of 150 minutes of exercise per week.  Check updated labs, handout given on management of hypertension including discussing sodium intake.  Continue same dose amlodipine  for now with RTC precautions.  Screening mammogram for breast cancer - Plan: MM Digital Screening  Psychophysiological insomnia - Plan: hydrOXYzine  (ATARAX ) 25 MG tablet  - Overall stable with rare need for hydroxyzine , continue same.  Depression with history of epigastric pain - Plan: sertraline  (ZOLOFT ) 50 MG tablet  - Declines changes in meds today.  Option of additional 25 mg sertraline  for improved control.  She will let me know.  New job has been positive.  Hyperlipidemia, unspecified hyperlipidemia type - Plan: Comprehensive metabolic panel with GFR, Lipid panel  - Check labs and adjust plan/recommendations accordingly pending meds for now.  If borderline I think it would be reasonable to try diet/exercise approach initially.  Meds ordered this encounter  Medications   amLODipine  (NORVASC ) 5 MG tablet    Sig: TAKE 1 TABLET(5 MG) BY MOUTH DAILY  Dispense:  90 tablet    Refill:  2   hydrOXYzine  (ATARAX ) 25 MG tablet    Sig: Take 0.5-1 tablets (12.5-25 mg total) by mouth at bedtime as needed.    Dispense:  30 tablet    Refill:  1   sertraline  (ZOLOFT ) 50 MG tablet    Sig: TAKE 1 TABLET(50 MG) BY MOUTH DAILY     Dispense:  90 tablet    Refill:  2   Patient Instructions  No change in meds today. See info on blood pressure below and let me know if there are changes/questions.   Managing Your Hypertension Hypertension, also called high blood pressure, is when the force of the blood pressing against the walls of the arteries is too strong. Arteries are blood vessels that carry blood from your heart throughout your body. Hypertension forces the heart to work harder to pump blood and may cause the arteries to become narrow or stiff. Understanding blood pressure readings A blood pressure reading includes a higher number over a lower number: The first, or top, number is called the systolic pressure. It is a measure of the pressure in your arteries as your heart beats. The second, or bottom number, is called the diastolic pressure. It is a measure of the pressure in your arteries as the heart relaxes. For most people, a normal blood pressure is below 120/80. Your personal target blood pressure may vary depending on your medical conditions, your age, and other factors. Blood pressure is classified into four stages. Based on your blood pressure reading, your health care provider may use the following stages to determine what type of treatment you need, if any. Systolic pressure and diastolic pressure are measured in a unit called millimeters of mercury (mmHg). Normal Systolic pressure: below 120. Diastolic pressure: below 80. Elevated Systolic pressure: 120-129. Diastolic pressure: below 80. Hypertension stage 1 Systolic pressure: 130-139. Diastolic pressure: 80-89. Hypertension stage 2 Systolic pressure: 140 or above. Diastolic pressure: 90 or above. How can this condition affect me? Managing your hypertension is very important. Over time, hypertension can damage the arteries and decrease blood flow to parts of the body, including the brain, heart, and kidneys. Having untreated or uncontrolled  hypertension can lead to: A heart attack. A stroke. A weakened blood vessel (aneurysm). Heart failure. Kidney damage. Eye damage. Memory and concentration problems. Vascular dementia. What actions can I take to manage this condition? Hypertension can be managed by making lifestyle changes and possibly by taking medicines. Your health care provider will help you make a plan to bring your blood pressure within a normal range. You may be referred for counseling on a healthy diet and physical activity. Nutrition  Eat a diet that is high in fiber and potassium, and low in salt (sodium), added sugar, and fat. An example eating plan is called the DASH diet. DASH stands for Dietary Approaches to Stop Hypertension. To eat this way: Eat plenty of fresh fruits and vegetables. Try to fill one-half of your plate at each meal with fruits and vegetables. Eat whole grains, such as whole-wheat pasta, brown rice, or whole-grain bread. Fill about one-fourth of your plate with whole grains. Eat low-fat dairy products. Avoid fatty cuts of meat, processed or cured meats, and poultry with skin. Fill about one-fourth of your plate with lean proteins such as fish, chicken without skin, beans, eggs, and tofu. Avoid pre-made and processed foods. These tend to be higher in sodium, added sugar, and fat. Reduce your daily sodium intake.  Many people with hypertension should eat less than 1,500 mg of sodium a day. Lifestyle  Work with your health care provider to maintain a healthy body weight or to lose weight. Ask what an ideal weight is for you. Get at least 30 minutes of exercise that causes your heart to beat faster (aerobic exercise) most days of the week. Activities may include walking, swimming, or biking. Include exercise to strengthen your muscles (resistance exercise), such as weight lifting, as part of your weekly exercise routine. Try to do these types of exercises for 30 minutes at least 3 days a week. Do not  use any products that contain nicotine or tobacco. These products include cigarettes, chewing tobacco, and vaping devices, such as e-cigarettes. If you need help quitting, ask your health care provider. Control any long-term (chronic) conditions you have, such as high cholesterol or diabetes. Identify your sources of stress and find ways to manage stress. This may include meditation, deep breathing, or making time for fun activities. Alcohol use Do not drink alcohol if: Your health care provider tells you not to drink. You are pregnant, may be pregnant, or are planning to become pregnant. If you drink alcohol: Limit how much you have to: 0-1 drink a day for women. 0-2 drinks a day for men. Know how much alcohol is in your drink. In the [place] one drink equals one 12 oz bottle of beer (355 mL), one 5 oz glass of wine (148 mL), or one 1 oz glass of hard liquor (44 mL). Medicines Your health care provider may prescribe medicine if lifestyle changes are not enough to get your blood pressure under control and if: Your systolic blood pressure is 130 or higher. Your diastolic blood pressure is 80 or higher. Take medicines only as told by your health care provider. Follow the directions carefully. Blood pressure medicines must be taken as told by your health care provider. The medicine does not work as well when you skip doses. Skipping doses also puts you at risk for problems. Monitoring Before you monitor your blood pressure: Do not smoke, drink caffeinated beverages, or exercise within 30 minutes before taking a measurement. Use the bathroom and empty your bladder (urinate). Sit quietly for at least 5 minutes before taking measurements. Monitor your blood pressure at home as told by your health care provider. To do this: Sit with your back straight and supported. Place your feet flat on the floor. Do not cross your legs. Support your arm on a flat surface, such as a table. Make sure your upper  arm is at heart level. Each time you measure, take two or three readings one minute apart and record the results. You may also need to have your blood pressure checked regularly by your health care provider. General information Talk with your health care provider about your diet, exercise habits, and other lifestyle factors that may be contributing to hypertension. Review all the medicines you take with your health care provider because there may be side effects or interactions. Keep all follow-up visits. Your health care provider can help you create and adjust your plan for managing your high blood pressure. Where to find more information National Heart, Lung, and Blood Institute: PopSte[employer].is American Heart Association: www.heart.org Contact a health care provider if: You think you are having a reaction to medicines you have taken. You have repeated (recurrent) headaches. You feel dizzy. You have swelling in your ankles. You have trouble with your vision. Get help right away if: You  develop a severe headache or confusion. You have unusual weakness or numbness, or you feel faint. You have severe pain in your chest or abdomen. You vomit repeatedly. You have trouble breathing. These symptoms may be an emergency. Get help right away. Call 911. Do not wait to see if the symptoms will go away. Do not drive yourself to the hospital. Summary Hypertension is when the force of blood pumping through your arteries is too strong. If this condition is not controlled, it may put you at risk for serious complications. Your personal target blood pressure may vary depending on your medical conditions, your age, and other factors. For most people, a normal blood pressure is less than 120/80. Hypertension is managed by lifestyle changes, medicines, or both. Lifestyle changes to help manage hypertension include losing weight, eating a healthy, low-sodium diet, exercising more, stopping smoking, and  limiting alcohol. This information is not intended to replace advice given to you by your health care provider. Make sure you discuss any questions you have with your health care provider. Document Revised: 04/11/2021 Document Reviewed: 04/11/2021 Elsevier Patient Education  2024 Elsevier Inc.        Signed,   Caro Christmas, MD [facility], [facility] 01/11/24 9:44 AM

## 2024-01-16 ENCOUNTER — Ambulatory Visit: Payer: Self-pay | Admitting: Family Medicine

## 2024-01-16 DIAGNOSIS — E875 Hyperkalemia: Secondary | ICD-10-CM

## 2024-01-18 ENCOUNTER — Ambulatory Visit
Admission: RE | Admit: 2024-01-18 | Discharge: 2024-01-18 | Disposition: A | Discharge status: Home / Self Care | Payer: PRIVATE HEALTH INSURANCE | Source: Ambulatory Visit | Attending: Family Medicine | Admitting: Family Medicine

## 2024-01-18 DIAGNOSIS — Z1231 Encounter for screening mammogram for malignant neoplasm of breast: Secondary | ICD-10-CM | POA: Diagnosis not present

## 2024-01-25 ENCOUNTER — Other Ambulatory Visit (INDEPENDENT_AMBULATORY_CARE_PROVIDER_SITE_OTHER)

## 2024-01-25 ENCOUNTER — Encounter: Payer: Self-pay | Admitting: Family Medicine

## 2024-01-25 DIAGNOSIS — E875 Hyperkalemia: Secondary | ICD-10-CM

## 2024-01-25 LAB — BASIC METABOLIC PANEL WITH GFR
BUN: 8 mg/dL (ref 6–23)
CO2: 28 meq/L (ref 19–32)
Calcium: 9.6 mg/dL (ref 8.4–10.5)
Chloride: 106 meq/L (ref 96–112)
Creatinine, Ser: 0.71 mg/dL (ref 0.40–1.20)
GFR: 89.38 mL/min (ref >60.00)
Glucose, Bld: 94 mg/dL (ref 70–99)
Potassium: 4.9 meq/L (ref 3.5–5.1)
Sodium: 140 meq/L (ref 135–145)

## 2024-01-26 ENCOUNTER — Ambulatory Visit: Payer: Self-pay | Admitting: Family Medicine

## 2024-01-26 NOTE — Telephone Encounter (Signed)
 Noted.  I have opened a separate telephone encounter with his name to provide information regarding establishing care.  See other encounter

## 2024-01-26 NOTE — Telephone Encounter (Signed)
I have scheduled patient.  

## 2024-02-29 DIAGNOSIS — F419 Anxiety disorder, unspecified: Secondary | ICD-10-CM | POA: Diagnosis not present

## 2024-02-29 DIAGNOSIS — F331 Major depressive disorder, recurrent, moderate: Secondary | ICD-10-CM | POA: Diagnosis not present

## 2024-03-07 DIAGNOSIS — F419 Anxiety disorder, unspecified: Secondary | ICD-10-CM | POA: Diagnosis not present

## 2024-03-07 DIAGNOSIS — F331 Major depressive disorder, recurrent, moderate: Secondary | ICD-10-CM | POA: Diagnosis not present

## 2024-03-21 DIAGNOSIS — F419 Anxiety disorder, unspecified: Secondary | ICD-10-CM | POA: Diagnosis not present

## 2024-03-21 DIAGNOSIS — F331 Major depressive disorder, recurrent, moderate: Secondary | ICD-10-CM | POA: Diagnosis not present

## 2024-03-28 DIAGNOSIS — F331 Major depressive disorder, recurrent, moderate: Secondary | ICD-10-CM | POA: Diagnosis not present

## 2024-03-28 DIAGNOSIS — F419 Anxiety disorder, unspecified: Secondary | ICD-10-CM | POA: Diagnosis not present

## 2024-04-04 DIAGNOSIS — F331 Major depressive disorder, recurrent, moderate: Secondary | ICD-10-CM | POA: Diagnosis not present

## 2024-04-04 DIAGNOSIS — F419 Anxiety disorder, unspecified: Secondary | ICD-10-CM | POA: Diagnosis not present

## 2024-04-18 DIAGNOSIS — F331 Major depressive disorder, recurrent, moderate: Secondary | ICD-10-CM | POA: Diagnosis not present

## 2024-04-18 DIAGNOSIS — F419 Anxiety disorder, unspecified: Secondary | ICD-10-CM | POA: Diagnosis not present

## 2024-04-25 DIAGNOSIS — F331 Major depressive disorder, recurrent, moderate: Secondary | ICD-10-CM | POA: Diagnosis not present

## 2024-04-25 DIAGNOSIS — F419 Anxiety disorder, unspecified: Secondary | ICD-10-CM | POA: Diagnosis not present

## 2024-04-26 ENCOUNTER — Encounter: Payer: Self-pay | Admitting: Family Medicine

## 2024-04-27 ENCOUNTER — Ambulatory Visit: Payer: Self-pay

## 2024-04-27 NOTE — Telephone Encounter (Signed)
 FYI Only or Action Required?: FYI only for provider.  Patient was last seen in primary care on 01/11/2024 by Levora Reyes SAUNDERS, MD.  Called Nurse Triage reporting Joint Swelling.  Symptoms began several weeks ago.  Interventions attempted: Rest, hydration, or home remedies.  Symptoms are: unchanged.  Triage Disposition: See HCP Within 4 Hours (Or PCP Triage)  Patient/caregiver understands and will follow disposition?: Yes, but will wait   Copied from CRM [id number]. Topic: Clinical - Red Word Triage >> Apr 27, 2024  4:45 PM [place] J wrote: [facility] that prompted transfer to Nurse Triage: Patient is needing to schedule due to her right ankle swelling. Reason for Disposition  [1] Thigh, calf, or ankle swelling AND [2] only 1 side  (Exceptions: Itchy, localized swelling; swelling is chronic.)    Provider aware. She sent pictures of swelling. Advised to call and schedule office visit  Answer Assessment - Initial Assessment Questions 1. LOCATION: Which ankle is swollen? Where is the swelling?     Right  2. ONSET: When did the swelling start?     2 weeks ago  3. SWELLING: How bad is the swelling? Or, How large is it? (e.g., mild, moderate, severe; size of localized swelling)      moderate 4. PAIN: Is there any pain? If Yes, ask: How bad is it? (Scale 0-10; or none, mild, moderate, severe)     Not painful, feels tight.  5. CAUSE: What do you think caused the ankle swelling?     unsure 6. OTHER SYMPTOMS: Do you have any other symptoms? (e.g., fever, chest pain, difficulty breathing, calf pain)     Denies  Protocols used: Ankle Swelling-A-AH

## 2024-04-28 ENCOUNTER — Ambulatory Visit (INDEPENDENT_AMBULATORY_CARE_PROVIDER_SITE_OTHER): Admitting: Family Medicine

## 2024-04-28 ENCOUNTER — Encounter: Payer: Self-pay | Admitting: Family Medicine

## 2024-04-28 VITALS — BP 128/66 | HR 83 | Temp 98.1°F | Resp 18 | Ht 63.0 in | Wt 155.6 lb

## 2024-04-28 DIAGNOSIS — R600 Localized edema: Secondary | ICD-10-CM

## 2024-04-28 DIAGNOSIS — Z23 Encounter for immunization: Secondary | ICD-10-CM

## 2024-04-28 NOTE — Patient Instructions (Signed)
 See information below about swelling in the legs.  The fact that improves overnight is reassuring.  As we discussed amlodipine  does have the side effect of ankle swelling, so that could be the culprit.  I will check some blood work, please have your lab visit on Monday.  Thank you for coming back to have that performed.  If any concerns on results I will let you know.  See the handout on some of the foods that can be higher in sodium to see if that may be contributing.  Give me an update in the next week or 2, and if symptoms have not improved we certainly can look at alternate options for blood pressure meds to see if that can help.  Let me know if any new or worsening symptoms.  Take care.   Peripheral Edema  Peripheral edema is swelling that is caused by a buildup of fluid. Peripheral edema most often affects the lower legs, ankles, and feet. It can also develop in the arms, hands, and face. The area of the body that has peripheral edema will look swollen. It may also feel heavy or warm. Your clothes may start to feel tight. Pressing on the area may make a temporary dent in your skin (pitting edema). You may not be able to move your swollen arm or leg as much as usual. There are many causes of peripheral edema. It can happen because of a complication of other conditions such as heart failure, kidney disease, or a problem with your circulation. It also can be a side effect of certain medicines or happen because of an infection. It often happens to women during pregnancy. Sometimes, the cause is not known. Follow these instructions at home: Managing pain, stiffness, and swelling  Raise (elevate) your legs while you are sitting or lying down. Move around often to prevent stiffness and to reduce swelling. Do not sit or stand for long periods of time. Do not wear tight clothing. Do not wear garters on your upper legs. Exercise your legs to get your circulation going. This helps to move the fluid back into  your blood vessels, and it may help the swelling go down. Wear compression stockings as told by your health care provider. These stockings help to prevent blood clots and reduce swelling in your legs. It is important that these are the correct size. These stockings should be prescribed by your doctor to prevent possible injuries. If elastic bandages or wraps are recommended, use them as told by your health care provider. Medicines Take over-the-counter and prescription medicines only as told by your health care provider. Your health care provider may prescribe medicine to help your body get rid of excess water (diuretic). Take this medicine if you are told to take it. General instructions Eat a low-salt (low-sodium) diet as told by your health care provider. Sometimes, eating less salt may reduce swelling. Pay attention to any changes in your symptoms. Moisturize your skin daily to help prevent skin from cracking and draining. Keep all follow-up visits. This is important. Contact a health care provider if: You have a fever. You have swelling in only one leg. You have increased swelling, redness, or pain in one or both of your legs. You have drainage or sores at the area where you have edema. Get help right away if: You have edema that starts suddenly or is getting worse, especially if you are pregnant or have a medical condition. You develop shortness of breath, especially when you are  lying down. You have pain in your chest or abdomen. You feel weak. You feel like you will faint. These symptoms may be an emergency. Get help right away. Call 911. Do not wait to see if the symptoms will go away. Do not drive yourself to the hospital. Summary Peripheral edema is swelling that is caused by a buildup of fluid. Peripheral edema most often affects the lower legs, ankles, and feet. Move around often to prevent stiffness and to reduce swelling. Do not sit or stand for long periods of time. Pay  attention to any changes in your symptoms. Contact a health care provider if you have edema that starts suddenly or is getting worse, especially if you are pregnant or have a medical condition. Get help right away if you develop shortness of breath, especially when lying down. This information is not intended to replace advice given to you by your health care provider. Make sure you discuss any questions you have with your health care provider. Document Revised: 04/01/2021 Document Reviewed: 04/01/2021 Elsevier Patient Education  2024 [employer].

## 2024-04-28 NOTE — Progress Notes (Signed)
 Subjective:  Patient ID: Jenny Davidson, female    DOB: Aug 31, 1960  Age: 63 y.o. MRN: [id number]  CC:  Chief Complaint  Patient presents with   Joint Swelling    Ankle swelling. For two weeks, worse on Right ankle. No known injury    HPI Jenny Davidson presents for   Ankle swelling As above, symptoms noted past 2-3 weeks.  No known injury.  Worse on right side.  She does take amlodipine  for hypertension.  Osteoporosis/osteopenia followed by [facility] on Reclast but again no injury. No pain. Skin feels tight at times when swollen. No blisters, wounds. Worse swelling last weekend. See photo on message 9/16.   No change in diet, No added salt to food.  No recent change in amlodipine  dose.  No arm/wrist swelling, no CP/DOE.  No recent prolonged car travel or air travel, no recent calf pain or swelling.  Tx: elevation.  Improves overnight to normal, swelling more at end of day.  Works at [employer], seated most of the day.   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 06/26/2023   Closed fracture of distal end of right radius 04/01/2023   Right wrist pain 03/12/2023   Essential hypertension 10/20/2022   Psychophysiological insomnia 10/20/2022   Hyperlipidemia 10/20/2022   Depression with anxiety 10/20/2022   Osteoporosis without current pathological fracture 10/20/2022   Abdominal pain 04/11/2022   Seasonal allergies 12/09/2013   Osteopenia 12/09/2013   Past Medical History:  Diagnosis Date   Allergy    Anxiety    Arthritis    Cataract    Depression    GERD (gastroesophageal reflux disease)    Hypertension    Osteoporosis    Peptic ulcer    Pneumonia    Past Surgical History:  Procedure Laterality Date   ADENOIDECTOMY      BREAST BIOPSY Left    BREAST EXCISIONAL BIOPSY Left    2008 tumor removed - benign   CATARACT EXTRACTION, BILATERAL Bilateral 2021   MELANOMA EXCISION Right 1985   hand   PARTIAL HYSTERECTOMY  1995   with tubal ligation   TUBAL LIGATION     Allergies  Allergen Reactions   Erythromycin Nausea And Vomiting   Iodine Rash    Pt states this is topical betadine   Prior to Admission medications   Medication Sig Start Date End Date Taking? Authorizing Provider  amLODipine  (NORVASC ) 5 MG tablet TAKE 1 TABLET(5 MG) BY MOUTH DAILY 01/11/24  Yes Levora Reyes SAUNDERS, MD  Calcium Carb-Cholecalciferol (CALCIUM 500 + D PO) Take 1 tablet by mouth daily.   Yes [provider]  fexofenadine (ALLEGRA) 60 MG tablet Take 60 mg by mouth 2 (two) times daily.   Yes [provider]  fluticasone  (FLONASE ) 50 MCG/ACT nasal spray Place into the nose.   Yes [provider]  FLUTICASONE  FUROATE IN Inhale into the lungs.   Yes [provider]  hydrOXYzine  (ATARAX ) 25 MG tablet Take 0.5-1 tablets (12.5-25 mg total) by mouth at bedtime as needed. 01/11/24  Yes Levora Reyes SAUNDERS, MD  omeprazole  (PRILOSEC) 10 MG capsule Take 10 mg by mouth daily.   Yes [provider]  ondansetron  (ZOFRAN ) 4 MG tablet Take 1 tablet (4 mg total)  by mouth every 8 (eight) hours as needed for nausea or vomiting. 04/21/22  Yes Levora Reyes SAUNDERS, MD  sertraline  (ZOLOFT ) 50 MG tablet TAKE 1 TABLET(50 MG) BY MOUTH DAILY 01/11/24  Yes Levora Reyes SAUNDERS, MD  zoledronic acid (RECLAST) 5 MG/100ML SOLN injection Inject 5 mg into the vein. yearly   Yes [provider]  HYDROcodone -acetaminophen  (NORCO/VICODIN) 5-325 MG tablet Take 1 tablet by mouth every 8 (eight) hours as needed. Patient not taking: Reported on 04/28/2024 12/28/23   [provider]   Social History   Socioeconomic History   Marital status: Married    Spouse name: Not on file   Number of children: 2   Years of education: Not  on file   Highest education level: Some college, no degree  Occupational History   Occupation: Dept Administrator, arts  Tobacco Use   Smoking status: Former    Current packs/day: 0.00    Types: Cigarettes    Quit date: 1988    Years since quitting: 37.7   Smokeless tobacco: Never  Vaping Use   Vaping status: Never Used  Substance and Sexual Activity   Alcohol use: Yes    Comment: occasional   Drug use: Never   Sexual activity: Not on file  Other Topics Concern   Not on file  Social History Narrative   Not on file   Social Drivers of Health   Financial Resource Strain: Low Risk  (04/27/2024)   Overall Financial Resource Strain (CARDIA)    Difficulty of Paying Living Expenses: Not very hard  Food Insecurity: Food Insecurity Present (04/27/2024)   Hunger Vital Sign    Worried About Running Out of Food in the Last Year: Sometimes true    Ran Out of Food in the Last Year: Never true  Transportation Needs: No Transportation Needs (04/27/2024)   PRAPARE - Administrator, Civil Service (Medical): No    Lack of Transportation (Non-Medical): No  Physical Activity: Insufficiently Active (04/27/2024)   Exercise Vital Sign    Days of Exercise per Week: 3 days    Minutes of Exercise per Session: 30 min  Stress: Stress Concern Present (04/27/2024)   [employer] of Occupational Health - Occupational Stress Questionnaire    Feeling of Stress: To some extent  Social Connections: Socially Isolated (04/27/2024)   Social Connection and Isolation Panel    Frequency of Communication with Friends and Family: Once a week    Frequency of Social Gatherings with Friends and Family: Once a week    Attends Religious Services: Never    Database administrator or Organizations: No    Attends Engineer, structural: Not on file    Marital Status: Married  Catering manager Violence: Not on file    Review of Systems Per HPI.   Objective:   Vitals:   04/28/24 1559  BP:  128/66  Pulse: 83  Resp: 18  Temp: 98.1 F (36.7 C)  TempSrc: Temporal  SpO2: 100%  Weight: 155 lb 9.6 oz (70.6 kg)  Height: 5' 3 (1.6 m)     Physical Exam Vitals reviewed.  Constitutional:      Appearance: Normal appearance. She is well-developed.  HENT:     Head: Normocephalic and atraumatic.  Eyes:     Conjunctiva/sclera: Conjunctivae normal.     Pupils: Pupils are equal, round, and reactive to light.  Neck:     Vascular: No carotid bruit.  Cardiovascular:     Rate and Rhythm:  Normal rate and regular rhythm.     Heart sounds: Normal heart sounds.  Pulmonary:     Effort: Pulmonary effort is normal. No respiratory distress.     Breath sounds: Normal breath sounds. No wheezing or rales.  Abdominal:     Palpations: Abdomen is soft. There is no pulsatile mass.     Tenderness: There is no abdominal tenderness.  Musculoskeletal:     Right lower leg: Edema (Trace to 1+ lower third lower leg on the right, trace edema on left.  Skin intact without erythema, no wounds.) present.     Left lower leg: Edema present.  Skin:    General: Skin is warm and dry.  Neurological:     Mental Status: She is alert and oriented to person, place, and time.  Psychiatric:        Mood and Affect: Mood normal.        Behavior: Behavior normal.         Assessment & Plan:  Jenny Davidson is a 63 y.o. female . Peripheral edema - Plan: CBC, Basic metabolic panel with GFR  Needs flu shot - Plan: Flu vaccine HIGH DOSE PF(Fluzone Trivalent) Trace pedal edema, worse in photo sent by patient.  Improves overnight, dependent edema.  She is on amlodipine  which could be contributing.  Plan for labs at lab only visit.  Handout given on salty 6 foods to avoid, decided against changing any medicine for now unless persistent pedal edema then would change amlodipine  to other agent.  She will give me an update in the next 2 weeks.  No orders of the defined types were placed in this encounter.  Patient  Instructions  See information below about swelling in the legs.  The fact that improves overnight is reassuring.  As we discussed amlodipine  does have the side effect of ankle swelling, so that could be the culprit.  I will check some blood work, please have your lab visit on Monday.  Thank you for coming back to have that performed.  If any concerns on results I will let you know.  See the handout on some of the foods that can be higher in sodium to see if that may be contributing.  Give me an update in the next week or 2, and if symptoms have not improved we certainly can look at alternate options for blood pressure meds to see if that can help.  Let me know if any new or worsening symptoms.  Take care.   Peripheral Edema  Peripheral edema is swelling that is caused by a buildup of fluid. Peripheral edema most often affects the lower legs, ankles, and feet. It can also develop in the arms, hands, and face. The area of the body that has peripheral edema will look swollen. It may also feel heavy or warm. Your clothes may start to feel tight. Pressing on the area may make a temporary dent in your skin (pitting edema). You may not be able to move your swollen arm or leg as much as usual. There are many causes of peripheral edema. It can happen because of a complication of other conditions such as heart failure, kidney disease, or a problem with your circulation. It also can be a side effect of certain medicines or happen because of an infection. It often happens to women during pregnancy. Sometimes, the cause is not known. Follow these instructions at home: Managing pain, stiffness, and swelling  Raise (elevate) your legs while you are sitting  or lying down. Move around often to prevent stiffness and to reduce swelling. Do not sit or stand for long periods of time. Do not wear tight clothing. Do not wear garters on your upper legs. Exercise your legs to get your circulation going. This helps to move the  fluid back into your blood vessels, and it may help the swelling go down. Wear compression stockings as told by your health care provider. These stockings help to prevent blood clots and reduce swelling in your legs. It is important that these are the correct size. These stockings should be prescribed by your doctor to prevent possible injuries. If elastic bandages or wraps are recommended, use them as told by your health care provider. Medicines Take over-the-counter and prescription medicines only as told by your health care provider. Your health care provider may prescribe medicine to help your body get rid of excess water (diuretic). Take this medicine if you are told to take it. General instructions Eat a low-salt (low-sodium) diet as told by your health care provider. Sometimes, eating less salt may reduce swelling. Pay attention to any changes in your symptoms. Moisturize your skin daily to help prevent skin from cracking and draining. Keep all follow-up visits. This is important. Contact a health care provider if: You have a fever. You have swelling in only one leg. You have increased swelling, redness, or pain in one or both of your legs. You have drainage or sores at the area where you have edema. Get help right away if: You have edema that starts suddenly or is getting worse, especially if you are pregnant or have a medical condition. You develop shortness of breath, especially when you are lying down. You have pain in your chest or abdomen. You feel weak. You feel like you will faint. These symptoms may be an emergency. Get help right away. Call 911. Do not wait to see if the symptoms will go away. Do not drive yourself to the hospital. Summary Peripheral edema is swelling that is caused by a buildup of fluid. Peripheral edema most often affects the lower legs, ankles, and feet. Move around often to prevent stiffness and to reduce swelling. Do not sit or stand for long periods  of time. Pay attention to any changes in your symptoms. Contact a health care provider if you have edema that starts suddenly or is getting worse, especially if you are pregnant or have a medical condition. Get help right away if you develop shortness of breath, especially when lying down. This information is not intended to replace advice given to you by your health care provider. Make sure you discuss any questions you have with your health care provider. Document Revised: 04/01/2021 Document Reviewed: 04/01/2021 Elsevier Patient Education  2024 Elsevier Inc.    Signed,   Reyes [place], MD [facility], [facility] 04/28/24 4:55 PM

## 2024-04-28 NOTE — Telephone Encounter (Signed)
 FYI

## 2024-04-29 ENCOUNTER — Other Ambulatory Visit

## 2024-05-02 ENCOUNTER — Other Ambulatory Visit (INDEPENDENT_AMBULATORY_CARE_PROVIDER_SITE_OTHER)

## 2024-05-02 DIAGNOSIS — F331 Major depressive disorder, recurrent, moderate: Secondary | ICD-10-CM | POA: Diagnosis not present

## 2024-05-02 DIAGNOSIS — R600 Localized edema: Secondary | ICD-10-CM

## 2024-05-02 DIAGNOSIS — F419 Anxiety disorder, unspecified: Secondary | ICD-10-CM | POA: Diagnosis not present

## 2024-05-02 LAB — BASIC METABOLIC PANEL WITH GFR
BUN: 13 mg/dL (ref 6–23)
CO2: 28 meq/L (ref 19–32)
Calcium: 9.5 mg/dL (ref 8.4–10.5)
Chloride: 105 meq/L (ref 96–112)
Creatinine, Ser: 0.72 mg/dL (ref 0.40–1.20)
GFR: 87.72 mL/min (ref >60.00)
Glucose, Bld: 93 mg/dL (ref 70–99)
Potassium: 4.6 meq/L (ref 3.5–5.1)
Sodium: 142 meq/L (ref 135–145)

## 2024-05-02 LAB — CBC
HCT: 42.4 % (ref 36.0–46.0)
Hemoglobin: 13.7 g/dL (ref 12.0–15.0)
MCHC: 32.2 g/dL (ref 30.0–36.0)
MCV: 81.1 fl (ref 78.0–100.0)
Platelets: 310 K/uL (ref 150.0–400.0)
RBC: 5.22 Mil/uL — ABNORMAL HIGH (ref 3.87–5.11)
RDW: 14.8 % (ref 11.5–15.5)
WBC: 6.6 K/uL (ref 4.0–10.5)

## 2024-05-06 ENCOUNTER — Ambulatory Visit: Payer: Self-pay | Admitting: Family Medicine

## 2024-05-09 DIAGNOSIS — F331 Major depressive disorder, recurrent, moderate: Secondary | ICD-10-CM | POA: Diagnosis not present

## 2024-05-09 DIAGNOSIS — F419 Anxiety disorder, unspecified: Secondary | ICD-10-CM | POA: Diagnosis not present

## 2024-05-13 DIAGNOSIS — M810 Age-related osteoporosis without current pathological fracture: Secondary | ICD-10-CM | POA: Diagnosis not present

## 2024-05-13 DIAGNOSIS — Z8781 Personal history of (healed) traumatic fracture: Secondary | ICD-10-CM | POA: Diagnosis not present

## 2024-05-13 DIAGNOSIS — E559 Vitamin D deficiency, unspecified: Secondary | ICD-10-CM | POA: Diagnosis not present

## 2024-05-23 DIAGNOSIS — F419 Anxiety disorder, unspecified: Secondary | ICD-10-CM | POA: Diagnosis not present

## 2024-06-06 DIAGNOSIS — M810 Age-related osteoporosis without current pathological fracture: Secondary | ICD-10-CM | POA: Diagnosis not present

## 2024-06-20 DIAGNOSIS — F331 Major depressive disorder, recurrent, moderate: Secondary | ICD-10-CM | POA: Diagnosis not present

## 2024-06-20 DIAGNOSIS — F419 Anxiety disorder, unspecified: Secondary | ICD-10-CM | POA: Diagnosis not present

## 2024-07-04 DIAGNOSIS — F411 Generalized anxiety disorder: Secondary | ICD-10-CM | POA: Diagnosis not present

## 2024-07-04 DIAGNOSIS — F331 Major depressive disorder, recurrent, moderate: Secondary | ICD-10-CM | POA: Diagnosis not present

## 2024-07-10 ENCOUNTER — Other Ambulatory Visit: Payer: Self-pay | Admitting: Family Medicine

## 2024-07-10 DIAGNOSIS — R1013 Epigastric pain: Secondary | ICD-10-CM

## 2024-07-18 DIAGNOSIS — F331 Major depressive disorder, recurrent, moderate: Secondary | ICD-10-CM | POA: Diagnosis not present

## 2024-07-18 DIAGNOSIS — F411 Generalized anxiety disorder: Secondary | ICD-10-CM | POA: Diagnosis not present

## 2024-07-25 ENCOUNTER — Encounter: Payer: Self-pay | Admitting: Family Medicine

## 2024-07-25 ENCOUNTER — Ambulatory Visit (INDEPENDENT_AMBULATORY_CARE_PROVIDER_SITE_OTHER): Payer: PRIVATE HEALTH INSURANCE | Admitting: Family Medicine

## 2024-07-25 VITALS — BP 110/86 | HR 83 | Temp 98.1°F | Resp 21 | Ht 63.0 in | Wt 161.4 lb

## 2024-07-25 DIAGNOSIS — Z0000 Encounter for general adult medical examination without abnormal findings: Secondary | ICD-10-CM | POA: Diagnosis not present

## 2024-07-25 DIAGNOSIS — Z130 Encounter for screening for diseases of the blood and blood-forming organs and certain disorders involving the immune mechanism: Secondary | ICD-10-CM

## 2024-07-25 DIAGNOSIS — Z1211 Encounter for screening for malignant neoplasm of colon: Secondary | ICD-10-CM | POA: Diagnosis not present

## 2024-07-25 DIAGNOSIS — I10 Essential (primary) hypertension: Secondary | ICD-10-CM | POA: Diagnosis not present

## 2024-07-25 DIAGNOSIS — Z79899 Other long term (current) drug therapy: Secondary | ICD-10-CM

## 2024-07-25 DIAGNOSIS — Z131 Encounter for screening for diabetes mellitus: Secondary | ICD-10-CM | POA: Diagnosis not present

## 2024-07-25 DIAGNOSIS — M810 Age-related osteoporosis without current pathological fracture: Secondary | ICD-10-CM | POA: Diagnosis not present

## 2024-07-25 DIAGNOSIS — F418 Other specified anxiety disorders: Secondary | ICD-10-CM

## 2024-07-25 LAB — COMPREHENSIVE METABOLIC PANEL WITH GFR
ALT: 16 U/L (ref 0–35)
AST: 22 U/L (ref 0–37)
Albumin: 4.5 g/dL (ref 3.5–5.2)
Alkaline Phosphatase: 81 U/L (ref 39–117)
BUN: 12 mg/dL (ref 6–23)
CO2: 28 meq/L (ref 19–32)
Calcium: 9.3 mg/dL (ref 8.4–10.5)
Chloride: 105 meq/L (ref 96–112)
Creatinine, Ser: 0.68 mg/dL (ref 0.40–1.20)
GFR: 91.23 mL/min (ref >60.00)
Glucose, Bld: 96 mg/dL (ref 70–99)
Potassium: 5 meq/L (ref 3.5–5.1)
Sodium: 140 meq/L (ref 135–145)
Total Bilirubin: 0.5 mg/dL (ref 0.2–1.2)
Total Protein: 7.3 g/dL (ref 6.0–8.3)

## 2024-07-25 LAB — LIPID PANEL
Cholesterol: 238 mg/dL — ABNORMAL HIGH (ref 0–200)
HDL: 63.4 mg/dL (ref >39.00)
LDL Cholesterol: 152 mg/dL — ABNORMAL HIGH (ref 0–99)
NonHDL: 174.66
Total CHOL/HDL Ratio: 4
Triglycerides: 113 mg/dL (ref 0.0–149.0)
VLDL: 22.6 mg/dL (ref 0.0–40.0)

## 2024-07-25 LAB — CBC
HCT: 42.3 % (ref 36.0–46.0)
Hemoglobin: 13.9 g/dL (ref 12.0–15.0)
MCHC: 32.9 g/dL (ref 30.0–36.0)
MCV: 80.7 fl (ref 78.0–100.0)
Platelets: 323 K/uL (ref 150.0–400.0)
RBC: 5.24 Mil/uL — ABNORMAL HIGH (ref 3.87–5.11)
RDW: 14.8 % (ref 11.5–15.5)
WBC: 8.1 K/uL (ref 4.0–10.5)

## 2024-07-25 LAB — MAGNESIUM: Magnesium: 2.3 mg/dL (ref 1.5–2.5)

## 2024-07-25 LAB — VITAMIN B12: Vitamin B-12: 614 pg/mL (ref 211–911)

## 2024-07-25 LAB — HEMOGLOBIN A1C: Hgb A1c MFr Bld: 5.7 % (ref 4.6–6.5)

## 2024-07-25 MED ORDER — AMLODIPINE BESYLATE 5 MG PO TABS: ORAL_TABLET | ORAL | 2 refills | Status: AC

## 2024-07-25 NOTE — Patient Instructions (Signed)
 Thank you for coming in today. No change in medications at this time. If there are any concerns on your bloodwork, I will let you know. Take care!  Preventive Care 69 Years and Older, Female Preventive care refers to lifestyle choices and visits with your health care provider that can promote health and wellness. Preventive care visits are also called wellness exams. What can I expect for my preventive care visit? Counseling Your health care provider may ask you questions about your: Medical history, including: Past medical problems. Family medical history. Pregnancy and menstrual history. History of falls. Current health, including: Memory and ability to understand (cognition). Emotional well-being. Home life and relationship well-being. Sexual activity and sexual health. Lifestyle, including: Alcohol, nicotine or tobacco, and drug use. Access to firearms. Diet, exercise, and sleep habits. Work and work Astronomer. Sunscreen use. Safety issues such as seatbelt and bike helmet use. Physical exam Your health care provider will check your: Height and weight. These may be used to calculate your BMI (body mass index). BMI is a measurement that tells if you are at a healthy weight. Waist circumference. This measures the distance around your waistline. This measurement also tells if you are at a healthy weight and may help predict your risk of certain diseases, such as type 2 diabetes and high blood pressure. Heart rate and blood pressure. Body temperature. Skin for abnormal spots. What immunizations do I need?  Vaccines are usually given at various ages, according to a schedule. Your health care provider will recommend vaccines for you based on your age, medical history, and lifestyle or other factors, such as travel or where you work. What tests do I need? Screening Your health care provider may recommend screening tests for certain conditions. This may include: Lipid and  cholesterol levels. Hepatitis C test. Hepatitis B test. HIV (human immunodeficiency virus) test. STI (sexually transmitted infection) testing, if you are at risk. Lung cancer screening. Colorectal cancer screening. Diabetes screening. This is done by checking your blood sugar (glucose) after you have not eaten for a while (fasting). Mammogram. Talk with your health care provider about how often you should have regular mammograms. BRCA-related cancer screening. This may be done if you have a family history of breast, ovarian, tubal, or peritoneal cancers. Bone density scan. This is done to screen for osteoporosis. Talk with your health care provider about your test results, treatment options, and if necessary, the need for more tests. Follow these instructions at home: Eating and drinking  Eat a diet that includes fresh fruits and vegetables, whole grains, lean protein, and low-fat dairy products. Limit your intake of foods with high amounts of sugar, saturated fats, and salt. Take vitamin and mineral supplements as recommended by your health care provider. Do not drink alcohol if your health care provider tells you not to drink. If you drink alcohol: Limit how much you have to 0-1 drink a day. Know how much alcohol is in your drink. In the [place] one drink equals one 12 oz bottle of beer (355 mL), one 5 oz glass of wine (148 mL), or one 1 oz glass of hard liquor (44 mL). Lifestyle Brush your teeth every morning and night with fluoride toothpaste. Floss one time each day. Exercise for at least 30 minutes 5 or more days each week. Do not use any products that contain nicotine or tobacco. These products include cigarettes, chewing tobacco, and vaping devices, such as e-cigarettes. If you need help quitting, ask your health care provider. Do not  use drugs. If you are sexually active, practice safe sex. Use a condom or other form of protection in order to prevent STIs. Take aspirin  only as told  by your health care provider. Make sure that you understand how much to take and what form to take. Work with your health care provider to find out whether it is safe and beneficial for you to take aspirin  daily. Ask your health care provider if you need to take a cholesterol-lowering medicine (statin). Find healthy ways to manage stress, such as: Meditation, yoga, or listening to music. Journaling. Talking to a trusted person. Spending time with friends and family. Minimize exposure to UV radiation to reduce your risk of skin cancer. Safety Always wear your seat belt while driving or riding in a vehicle. Do not drive: If you have been drinking alcohol. Do not ride with someone who has been drinking. When you are tired or distracted. While texting. If you have been using any mind-altering substances or drugs. Wear a helmet and other protective equipment during sports activities. If you have firearms in your house, make sure you follow all gun safety procedures. What's next? Visit your health care provider once a year for an annual wellness visit. Ask your health care provider how often you should have your eyes and teeth checked. Stay up to date on all vaccines. This information is not intended to replace advice given to you by your health care provider. Make sure you discuss any questions you have with your health care provider. Document Revised: 01/23/2021 Document Reviewed: 01/23/2021 Elsevier Patient Education  2024 [employer].

## 2024-07-25 NOTE — Progress Notes (Signed)
 Subjective:  Patient ID: Jenny Davidson, female    DOB: Aug 05, 1961  Age: 63 y.o. MRN: [id number]  CC:  Chief Complaint  Patient presents with   Annual Exam    No questions or concerns.     HPI Jenny Davidson presents for Annual Exam Doing ok - no health changes. Treating plantar fasciitis.   PCP, [facility], [facility].  Reclast for osteoporosis, mild osteopenia.  On calcium and vitamin D supplementation. Ortho/hand, Dr. Shari, right finger/hand pain.  Prior middle phalanx fracture, R radius fracture last year. Has been released  Therapist/psychology, Lauraine Jester.  Major depressive disorder, recurrent,Anxiety disorder.  Hydroxyzine  as needed (once per month),  Zoloft  50 mg daily. Feels like sx's stable at current dose, no side effects.  Gastroenterology, previously Dr. San with endoscopy in April of last year. Prilosec daily for GERD. Stable.   Hypertension: Amlodipine  5 mg daily without any new side effects.  Home readings: stable - 80 diastolic.  BP Readings from Last 3 Encounters:  07/25/24 110/86  04/28/24 128/66  01/11/24 132/86   Lab Results  Component Value Date   CREATININE 0.72 05/02/2024        01/11/2024    9:41 AM 04/27/2023    9:50 AM 10/20/2022    9:22 AM 06/09/2022   10:15 AM 05/08/2022    9:15 AM  Depression screen PHQ 2/9  Decreased Interest 1 1 1 1 3   Down, Depressed, Hopeless 1 1 1 1 3   PHQ - 2 Score 2 2 2 2 6   Altered sleeping 1 1 2 1 3   Tired, decreased energy 1 1 2 2 3   Change in appetite 1 1 2 2 3   Feeling bad or failure about yourself  1 1 1 1 2   Trouble concentrating 1 1 1 1  0  Moving slowly or fidgety/restless 1 0 1 0 0  Suicidal thoughts 0 0 0 1 1  PHQ-9 Score 8  7  11  10  18    Difficult doing work/chores Somewhat difficult Not difficult at all        Data saved with a previous flowsheet row definition    Health Maintenance  Topic Date Due   Medicare Annual Wellness (AWV)  Never done    Colonoscopy  Never done   Pneumococcal Vaccine: 50+ Years (1 of 1 - PCV) 11/19/2008   Bone Density Scan  01/10/2025 (Originally 11/20/2023)   COVID-19 Vaccine (5 - 2025-26 season) 11/28/2024   Cervical Cancer Screening (HPV/Pap Cotest)  12/07/2024   Mammogram  01/17/2026   DTaP/Tdap/Td (2 - Td or Tdap) 05/15/2029   Influenza Vaccine  Completed   Hepatitis C Screening  Completed   HIV Screening  Completed   Zoster Vaccines- Shingrix   Completed   Hepatitis B Vaccines 19-59 Average Risk  Aged Out   Meningococcal B Vaccine  Aged Out  Cologuard had been ordered previously for colon cancer screening but not submitted. No FH of colon CA, no personal hx of polyps or bleeding.  Mammogram 01/18/2024, no evidence of malignancy, repeat in 1 year. negative Pap with negative HPV testing in 2021. No new vaginal d/c, bleeding, lesions.   Immunization History  Administered Date(s) Administered   INFLUENZA, HIGH DOSE SEASONAL PF 04/28/2024   Influenza, Seasonal, Injecte, Preservative Fre 04/27/2023   Influenza,inj,Quad PF,6+ Mos 07/15/2015, 05/16/2019, 06/08/2020, 05/08/2022   PFIZER(Purple Top)SARS-COV-2 Vaccination 10/20/2019, 11/14/2019, 06/27/2020   Pneumococcal-Unspecified 05/30/2024   Respiratory Syncytial Virus Vaccine ,Recomb Aduvanted(Arexvy ) 07/21/2022   Tdap  05/16/2019   Unspecified SARS-COV-2 Vaccination 05/30/2024   Zoster Recombinant(Shingrix ) 10/20/2022, 04/27/2023  Had covid booster and flu this season.  Had shingles vaccine earlier this year as well.   No results found. Optho - this year.   Dental: every 6 months.   Alcohol: rare - 3-4 per month.   Tobacco: none, no vaping. Delta 9 gummy for sleep at times.   Exercise: minimal. Works 6 days per week. [employer].  Wt Readings from Last 3 Encounters:  07/25/24 161 lb 6.4 oz (73.2 kg)  04/28/24 155 lb 9.6 oz (70.6 kg)  01/11/24 154 lb (69.9 kg)  Body mass index is 28.59 kg/m.   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 06/26/2023   Closed fracture of distal end of right radius 04/01/2023   Right wrist pain 03/12/2023   Essential hypertension 10/20/2022   Psychophysiological insomnia 10/20/2022   Hyperlipidemia 10/20/2022   Depression with anxiety 10/20/2022   Osteoporosis without current pathological fracture 10/20/2022   Abdominal pain 04/11/2022   Seasonal allergies 12/09/2013   Osteopenia 12/09/2013   Past Medical History:  Diagnosis Date   Allergy    Anxiety    Arthritis    Cataract    Depression    GERD (gastroesophageal reflux disease)    Hypertension    Osteoporosis    Peptic ulcer    Pneumonia    Past Surgical History:  Procedure Laterality Date   ADENOIDECTOMY     BREAST BIOPSY Left    BREAST EXCISIONAL BIOPSY Left    2008 tumor removed - benign   CATARACT EXTRACTION, BILATERAL Bilateral 2021   MELANOMA EXCISION Right 1985   hand   PARTIAL HYSTERECTOMY  1995   with tubal ligation   TUBAL LIGATION     Allergies[1] Prior to Admission medications  Medication Sig Start Date End Date Taking? Authorizing Provider  amLODipine  (NORVASC ) 5 MG tablet TAKE 1 TABLET(5 MG) BY MOUTH DAILY 01/11/24  Yes Levora Reyes SAUNDERS, MD  Calcium Carb-Cholecalciferol (CALCIUM 500 + D PO) Take 1 tablet by mouth daily.   Yes [provider]  fexofenadine (ALLEGRA) 60 MG tablet Take 60 mg by mouth 2 (two) times daily.   Yes [provider]  fluticasone  (FLONASE ) 50 MCG/ACT nasal spray Place into the nose.   Yes [provider]  FLUTICASONE  FUROATE IN Inhale into the lungs.   Yes [provider]  hydrOXYzine  (ATARAX ) 25 MG tablet Take 0.5-1 tablets (12.5-25 mg total) by mouth at bedtime as needed. 01/11/24  Yes Levora Reyes SAUNDERS, MD  omeprazole  (PRILOSEC)  10 MG capsule Take 10 mg by mouth daily.   Yes [provider]  ondansetron  (ZOFRAN ) 4 MG tablet Take 1 tablet (4 mg total) by mouth every 8 (eight) hours as needed for nausea or vomiting. 04/21/22  Yes Levora Reyes SAUNDERS, MD  sertraline  (ZOLOFT ) 50 MG tablet TAKE 1 TABLET(50 MG) BY MOUTH DAILY 07/12/24  Yes Levora Reyes SAUNDERS, MD  zoledronic acid (RECLAST) 5 MG/100ML SOLN injection Inject 5 mg into the vein. yearly   Yes [provider]  HYDROcodone -acetaminophen  (NORCO/VICODIN) 5-325 MG tablet Take 1 tablet by mouth every 8 (eight) hours as needed. Patient not taking: Reported on 07/25/2024 12/28/23   [provider]   Social History  Socioeconomic History   Marital status: Married    Spouse name: Not on file   Number of children: 2   Years of education: Not on file   Highest education level: Some college, no degree  Occupational History   Occupation: Dept Administrator, Arts  Tobacco Use   Smoking status: Former    Current packs/day: 0.00    Types: Cigarettes    Quit date: 1988    Years since quitting: 37.9   Smokeless tobacco: Never  Vaping Use   Vaping status: Never Used  Substance and Sexual Activity   Alcohol use: Yes    Comment: occasional   Drug use: Never   Sexual activity: Not on file  Other Topics Concern   Not on file  Social History Narrative   Not on file   Social Drivers of Health   Tobacco Use: Medium Risk (07/25/2024)   Patient History    Smoking Tobacco Use: Former    Smokeless Tobacco Use: Never    Passive Exposure: Not on Actuary Strain: Low Risk (04/27/2024)   Overall Financial Resource Strain (CARDIA)    Difficulty of Paying Living Expenses: Not very hard  Food Insecurity: Food Insecurity Present (04/27/2024)   Epic    Worried About Programme Researcher, Broadcasting/film/video in the Last Year: Sometimes true    Ran Out of Food in the Last Year: Never true  Transportation Needs: No Transportation Needs (04/27/2024)   Epic    Lack  of Transportation (Medical): No    Lack of Transportation (Non-Medical): No  Physical Activity: Insufficiently Active (04/27/2024)   Exercise Vital Sign    Days of Exercise per Week: 3 days    Minutes of Exercise per Session: 30 min  Stress: Stress Concern Present (04/27/2024)   [employer] of Occupational Health - Occupational Stress Questionnaire    Feeling of Stress: To some extent  Social Connections: Socially Isolated (04/27/2024)   Social Connection and Isolation Panel    Frequency of Communication with Friends and Family: Once a week    Frequency of Social Gatherings with Friends and Family: Once a week    Attends Religious Services: Never    Database Administrator or Organizations: No    Attends Engineer, Structural: Not on file    Marital Status: Married  Catering Manager Violence: Not on file  Depression (PHQ2-9): Medium Risk (01/11/2024)   Depression (PHQ2-9)    PHQ-2 Score: 8  Alcohol Screen: Low Risk (04/27/2024)   Alcohol Screen    Last Alcohol Screening Score (AUDIT): 2  Housing: Low Risk (04/27/2024)   Epic    Unable to Pay for Housing in the Last Year: No    Number of Times Moved in the Last Year: 0    Homeless in the Last Year: No  Utilities: Not on file  Health Literacy: Not on file    Review of Systems 13 point review of systems per patient health survey noted.  Negative other than as indicated above or in HPI.    Objective:   Vitals:   07/25/24 0851  BP: 110/86  Pulse: 83  Resp: (!) 21  Temp: 98.1 F (36.7 C)  TempSrc: Temporal  SpO2: 100%  Weight: 161 lb 6.4 oz (73.2 kg)  Height: 5' 3 (1.6 m)     Physical Exam Vitals reviewed.  Constitutional:      Appearance: She is well-developed.  HENT:     Head: Normocephalic and atraumatic.  Right Ear: External ear normal.     Left Ear: External ear normal.  Eyes:     Conjunctiva/sclera: Conjunctivae normal.     Pupils: Pupils are equal, round, and reactive to light.  Neck:      Thyroid: No thyromegaly.  Cardiovascular:     Rate and Rhythm: Normal rate and regular rhythm.     Heart sounds: Normal heart sounds. No murmur heard. Pulmonary:     Effort: Pulmonary effort is normal. No respiratory distress.     Breath sounds: Normal breath sounds. No wheezing.  Abdominal:     General: Bowel sounds are normal.     Palpations: Abdomen is soft.     Tenderness: There is no abdominal tenderness.  Musculoskeletal:        General: No tenderness. Normal range of motion.     Cervical back: Normal range of motion and neck supple.  Lymphadenopathy:     Cervical: No cervical adenopathy.  Skin:    General: Skin is warm and dry.     Findings: No rash.  Neurological:     Mental Status: She is alert and oriented to person, place, and time.  Psychiatric:        Behavior: Behavior normal.        Thought Content: Thought content normal.        Assessment & Plan:  Jenny Davidson is a 63 y.o. female . Annual physical exam  - -anticipatory guidance as below in AVS, screening labs above. Health maintenance items as above in HPI discussed/recommended as applicable.   Depression with anxiety  - stable with current regimen, continue Zoloft  same dose.  RTC precautions.  Has hydroxyzine  if needed, rarely needed.  Osteoporosis without current pathological fracture, unspecified osteoporosis type  - Continue follow-up with [facility], specialist.  Essential hypertension - Plan: Comprehensive metabolic panel with GFR, Lipid panel, amLODipine  (NORVASC ) 5 MG tablet  - Stable with current med regimen, continue to monitor.  Meds refilled, labs as above.  Long-term current use of proton pump inhibitor therapy - Plan: B12, Magnesium  - GERD stable with PPI but with chronic daily use will check a B12 and magnesium for deficiencies, also checking CBC to rule out anemia and then could look at iron if needed.  Screening for malignant neoplasm of colon - Plan: Cologuard  - Repeat  Cologuard ordered. Screening for diabetes mellitus - Plan: Hemoglobin A1c  Screening, anemia, deficiency, iron - Plan: CBC   Meds ordered this encounter  Medications   amLODipine  (NORVASC ) 5 MG tablet    Sig: TAKE 1 TABLET(5 MG) BY MOUTH DAILY    Dispense:  90 tablet    Refill:  2   Patient Instructions  Thank you for coming in today. No change in medications at this time. If there are any concerns on your bloodwork, I will let you know. Take care!   Preventive Care 52 Years and Older, Female Preventive care refers to lifestyle choices and visits with your health care provider that can promote health and wellness. Preventive care visits are also called wellness exams. What can I expect for my preventive care visit? Counseling Your health care provider may ask you questions about your: Medical history, including: Past medical problems. Family medical history. Pregnancy and menstrual history. History of falls. Current health, including: Memory and ability to understand (cognition). Emotional well-being. Home life and relationship well-being. Sexual activity and sexual health. Lifestyle, including: Alcohol, nicotine or tobacco, and drug use. Access to firearms. Diet,  exercise, and sleep habits. Work and work astronomer. Sunscreen use. Safety issues such as seatbelt and bike helmet use. Physical exam Your health care provider will check your: Height and weight. These may be used to calculate your BMI (body mass index). BMI is a measurement that tells if you are at a healthy weight. Waist circumference. This measures the distance around your waistline. This measurement also tells if you are at a healthy weight and may help predict your risk of certain diseases, such as type 2 diabetes and high blood pressure. Heart rate and blood pressure. Body temperature. Skin for abnormal spots. What immunizations do I need?  Vaccines are usually given at various ages, according to a  schedule. Your health care provider will recommend vaccines for you based on your age, medical history, and lifestyle or other factors, such as travel or where you work. What tests do I need? Screening Your health care provider may recommend screening tests for certain conditions. This may include: Lipid and cholesterol levels. Hepatitis C test. Hepatitis B test. HIV (human immunodeficiency virus) test. STI (sexually transmitted infection) testing, if you are at risk. Lung cancer screening. Colorectal cancer screening. Diabetes screening. This is done by checking your blood sugar (glucose) after you have not eaten for a while (fasting). Mammogram. Talk with your health care provider about how often you should have regular mammograms. BRCA-related cancer screening. This may be done if you have a family history of breast, ovarian, tubal, or peritoneal cancers. Bone density scan. This is done to screen for osteoporosis. Talk with your health care provider about your test results, treatment options, and if necessary, the need for more tests. Follow these instructions at home: Eating and drinking  Eat a diet that includes fresh fruits and vegetables, whole grains, lean protein, and low-fat dairy products. Limit your intake of foods with high amounts of sugar, saturated fats, and salt. Take vitamin and mineral supplements as recommended by your health care provider. Do not drink alcohol if your health care provider tells you not to drink. If you drink alcohol: Limit how much you have to 0-1 drink a day. Know how much alcohol is in your drink. In the [place] one drink equals one 12 oz bottle of beer (355 mL), one 5 oz glass of wine (148 mL), or one 1 oz glass of hard liquor (44 mL). Lifestyle Brush your teeth every morning and night with fluoride toothpaste. Floss one time each day. Exercise for at least 30 minutes 5 or more days each week. Do not use any products that contain nicotine or  tobacco. These products include cigarettes, chewing tobacco, and vaping devices, such as e-cigarettes. If you need help quitting, ask your health care provider. Do not use drugs. If you are sexually active, practice safe sex. Use a condom or other form of protection in order to prevent STIs. Take aspirin only as told by your health care provider. Make sure that you understand how much to take and what form to take. Work with your health care provider to find out whether it is safe and beneficial for you to take aspirin daily. Ask your health care provider if you need to take a cholesterol-lowering medicine (statin). Find healthy ways to manage stress, such as: Meditation, yoga, or listening to music. Journaling. Talking to a trusted person. Spending time with friends and family. Minimize exposure to UV radiation to reduce your risk of skin cancer. Safety Always wear your seat belt while driving or riding in a  vehicle. Do not drive: If you have been drinking alcohol. Do not ride with someone who has been drinking. When you are tired or distracted. While texting. If you have been using any mind-altering substances or drugs. Wear a helmet and other protective equipment during sports activities. If you have firearms in your house, make sure you follow all gun safety procedures. What's next? Visit your health care provider once a year for an annual wellness visit. Ask your health care provider how often you should have your eyes and teeth checked. Stay up to date on all vaccines. This information is not intended to replace advice given to you by your health care provider. Make sure you discuss any questions you have with your health care provider. Document Revised: 01/23/2021 Document Reviewed: 01/23/2021 Elsevier Patient Education  2024 Elsevier Inc.     Signed,   Reyes Pines, MD [facility], [facility] 07/25/2024 9:34 AM        [1]  Allergies Allergen Reactions   Erythromycin Nausea And Vomiting   Iodine Rash    Pt states this is topical betadine

## 2024-07-29 ENCOUNTER — Ambulatory Visit: Payer: Self-pay | Admitting: Family Medicine

## 2024-08-27 LAB — COLOGUARD: COLOGUARD: NEGATIVE

## 2024-09-05 NOTE — Progress Notes (Signed)
 Lab results have been discussed.   Verbalized understanding? Yes  Are there any questions? No

## 2025-01-23 ENCOUNTER — Ambulatory Visit: Admitting: Family Medicine
